# Patient Record
Sex: Male | Born: 1935 | Race: White | Hispanic: No | Marital: Married | Smoking: Former smoker
Health system: Southern US, Community
[De-identification: ages and names within clinical notes are randomized; demographics above are authoritative.]

## PROBLEM LIST (undated history)

## (undated) DIAGNOSIS — I447 Left bundle-branch block, unspecified: Secondary | ICD-10-CM

## (undated) DIAGNOSIS — S2242XA Multiple fractures of ribs, left side, initial encounter for closed fracture: Secondary | ICD-10-CM

## (undated) DIAGNOSIS — I350 Nonrheumatic aortic (valve) stenosis: Secondary | ICD-10-CM

## (undated) DIAGNOSIS — T4145XA Adverse effect of unspecified anesthetic, initial encounter: Secondary | ICD-10-CM

## (undated) DIAGNOSIS — G4730 Sleep apnea, unspecified: Secondary | ICD-10-CM

## (undated) DIAGNOSIS — S42102A Fracture of unspecified part of scapula, left shoulder, initial encounter for closed fracture: Secondary | ICD-10-CM

## (undated) DIAGNOSIS — S20219A Contusion of unspecified front wall of thorax, initial encounter: Secondary | ICD-10-CM

## (undated) DIAGNOSIS — S2239XA Fracture of one rib, unspecified side, initial encounter for closed fracture: Secondary | ICD-10-CM

## (undated) DIAGNOSIS — I428 Other cardiomyopathies: Secondary | ICD-10-CM

## (undated) DIAGNOSIS — G5620 Lesion of ulnar nerve, unspecified upper limb: Secondary | ICD-10-CM

## (undated) DIAGNOSIS — I10 Essential (primary) hypertension: Secondary | ICD-10-CM

## (undated) DIAGNOSIS — I519 Heart disease, unspecified: Secondary | ICD-10-CM

## (undated) DIAGNOSIS — S270XXA Traumatic pneumothorax, initial encounter: Secondary | ICD-10-CM

## (undated) HISTORY — PX: HERNIA REPAIR: SHX51

## (undated) HISTORY — DX: Heart disease, unspecified: I51.9

## (undated) HISTORY — DX: Left bundle-branch block, unspecified: I44.7

## (undated) HISTORY — DX: Other cardiomyopathies: I42.8

## (undated) HISTORY — PX: NO PAST SURGERIES: SHX2092

## (undated) HISTORY — DX: Nonrheumatic aortic (valve) stenosis: I35.0

---
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
## 1999-11-19 ENCOUNTER — Other Ambulatory Visit: Admission: RE | Admit: 1999-11-19 | Discharge: 1999-11-19 | Payer: Self-pay | Admitting: Gastroenterology

## 1999-12-27 ENCOUNTER — Encounter: Payer: Self-pay | Admitting: Urology

## 1999-12-27 ENCOUNTER — Ambulatory Visit (HOSPITAL_COMMUNITY): Admission: RE | Admit: 1999-12-27 | Discharge: 1999-12-27 | Payer: Self-pay | Admitting: Urology

## 2000-01-31 ENCOUNTER — Ambulatory Visit (HOSPITAL_COMMUNITY): Admission: RE | Admit: 2000-01-31 | Discharge: 2000-01-31 | Payer: Self-pay | Admitting: Urology

## 2000-07-06 ENCOUNTER — Encounter: Payer: Self-pay | Admitting: Urology

## 2000-07-06 ENCOUNTER — Ambulatory Visit (HOSPITAL_COMMUNITY): Admission: RE | Admit: 2000-07-06 | Discharge: 2000-07-06 | Payer: Self-pay | Admitting: Urology

## 2001-07-24 ENCOUNTER — Encounter: Payer: Self-pay | Admitting: Urology

## 2001-07-24 ENCOUNTER — Ambulatory Visit (HOSPITAL_COMMUNITY): Admission: RE | Admit: 2001-07-24 | Discharge: 2001-07-24 | Payer: Self-pay | Admitting: Urology

## 2002-08-08 ENCOUNTER — Encounter: Payer: Self-pay | Admitting: Cardiology

## 2002-08-08 ENCOUNTER — Ambulatory Visit (HOSPITAL_COMMUNITY): Admission: RE | Admit: 2002-08-08 | Discharge: 2002-08-08 | Payer: Self-pay | Admitting: Cardiology

## 2002-10-08 ENCOUNTER — Ambulatory Visit (HOSPITAL_BASED_OUTPATIENT_CLINIC_OR_DEPARTMENT_OTHER): Admission: RE | Admit: 2002-10-08 | Discharge: 2002-10-08 | Payer: Self-pay | Admitting: Cardiology

## 2003-05-26 ENCOUNTER — Encounter: Admission: RE | Admit: 2003-05-26 | Discharge: 2003-05-26 | Payer: Self-pay | Admitting: Family Medicine

## 2003-08-22 ENCOUNTER — Emergency Department (HOSPITAL_COMMUNITY): Admission: EM | Admit: 2003-08-22 | Discharge: 2003-08-22 | Payer: Self-pay | Admitting: Emergency Medicine

## 2004-05-11 ENCOUNTER — Ambulatory Visit: Payer: Self-pay | Admitting: Family Medicine

## 2004-09-09 ENCOUNTER — Ambulatory Visit: Payer: Self-pay | Admitting: Family Medicine

## 2005-04-06 ENCOUNTER — Ambulatory Visit: Payer: Self-pay | Admitting: Internal Medicine

## 2011-01-20 ENCOUNTER — Encounter: Payer: Self-pay | Admitting: Gastroenterology

## 2011-01-24 ENCOUNTER — Telehealth: Payer: Self-pay | Admitting: Gastroenterology

## 2011-01-24 NOTE — Telephone Encounter (Signed)
Spoke with pt and he is having problems with an inguinal hernia that is bothering him. Let pt know Dr. Arlyce Dice did not take care of these, he needed to see a surgeon. Pt given the name and number for [facility]. Pt verbalized understanding. Appt cancelled with Dr. Arlyce Dice.

## 2011-03-01 ENCOUNTER — Ambulatory Visit: Payer: Self-pay | Admitting: Gastroenterology

## 2011-03-20 ENCOUNTER — Ambulatory Visit: Payer: Self-pay | Admitting: Family Medicine

## 2011-10-04 ENCOUNTER — Inpatient Hospital Stay (HOSPITAL_COMMUNITY)
Admission: EM | Admit: 2011-10-04 | Discharge: 2011-10-08 | DRG: 200 | Disposition: A | Discharge status: Home / Self Care | Payer: No Typology Code available for payment source | Attending: General Surgery | Admitting: General Surgery

## 2011-10-04 ENCOUNTER — Emergency Department (HOSPITAL_COMMUNITY): Payer: No Typology Code available for payment source

## 2011-10-04 ENCOUNTER — Encounter (HOSPITAL_COMMUNITY): Payer: Self-pay

## 2011-10-04 DIAGNOSIS — S2249XA Multiple fractures of ribs, unspecified side, initial encounter for closed fracture: Secondary | ICD-10-CM

## 2011-10-04 DIAGNOSIS — D62 Acute posthemorrhagic anemia: Secondary | ICD-10-CM | POA: Diagnosis not present

## 2011-10-04 DIAGNOSIS — Y92410 Unspecified street and highway as the place of occurrence of the external cause: Secondary | ICD-10-CM

## 2011-10-04 DIAGNOSIS — S42102A Fracture of unspecified part of scapula, left shoulder, initial encounter for closed fracture: Secondary | ICD-10-CM | POA: Diagnosis present

## 2011-10-04 DIAGNOSIS — S2242XA Multiple fractures of ribs, left side, initial encounter for closed fracture: Secondary | ICD-10-CM

## 2011-10-04 DIAGNOSIS — S2231XA Fracture of one rib, right side, initial encounter for closed fracture: Secondary | ICD-10-CM | POA: Diagnosis present

## 2011-10-04 DIAGNOSIS — S270XXA Traumatic pneumothorax, initial encounter: Secondary | ICD-10-CM

## 2011-10-04 DIAGNOSIS — Z87891 Personal history of nicotine dependence: Secondary | ICD-10-CM

## 2011-10-04 DIAGNOSIS — I10 Essential (primary) hypertension: Secondary | ICD-10-CM | POA: Diagnosis present

## 2011-10-04 DIAGNOSIS — S42109A Fracture of unspecified part of scapula, unspecified shoulder, initial encounter for closed fracture: Secondary | ICD-10-CM

## 2011-10-04 DIAGNOSIS — S40212A Abrasion of left shoulder, initial encounter: Secondary | ICD-10-CM

## 2011-10-04 DIAGNOSIS — S40019A Contusion of unspecified shoulder, initial encounter: Secondary | ICD-10-CM | POA: Diagnosis present

## 2011-10-04 DIAGNOSIS — S42199A Fracture of other part of scapula, unspecified shoulder, initial encounter for closed fracture: Secondary | ICD-10-CM | POA: Diagnosis present

## 2011-10-04 DIAGNOSIS — I428 Other cardiomyopathies: Secondary | ICD-10-CM | POA: Diagnosis present

## 2011-10-04 HISTORY — DX: Fracture of unspecified part of scapula, left shoulder, initial encounter for closed fracture: S42.102A

## 2011-10-04 HISTORY — DX: Other motorcycle driver injured in collision with unspecified motor vehicles in traffic accident, initial encounter: V29.408A

## 2011-10-04 HISTORY — DX: Traumatic pneumothorax, initial encounter: S27.0XXA

## 2011-10-04 HISTORY — DX: Multiple fractures of ribs, left side, initial encounter for closed fracture: S22.42XA

## 2011-10-04 HISTORY — DX: Essential (primary) hypertension: I10

## 2011-10-04 LAB — COMPREHENSIVE METABOLIC PANEL
ALT: 19 U/L (ref 0–53)
AST: 28 U/L (ref 0–37)
Albumin: 3.7 g/dL (ref 3.5–5.2)
Alkaline Phosphatase: 57 U/L (ref 39–117)
BUN: 17 mg/dL (ref 6–23)
CO2: 27 mEq/L (ref 19–32)
Calcium: 9.6 mg/dL (ref 8.4–10.5)
Chloride: 101 mEq/L (ref 96–112)
Creatinine, Ser: 0.95 mg/dL (ref 0.50–1.35)
GFR calc Af Amer: >90 mL/min (ref >90)
GFR calc non Af Amer: 78 mL/min — ABNORMAL LOW (ref >90)
Glucose, Bld: 116 mg/dL — ABNORMAL HIGH (ref 70–99)
Potassium: 3.5 mEq/L (ref 3.5–5.1)
Sodium: 137 mEq/L (ref 135–145)
Total Bilirubin: 0.4 mg/dL (ref 0.3–1.2)
Total Protein: 7 g/dL (ref 6.0–8.3)

## 2011-10-04 LAB — CBC
HCT: 39.9 % (ref 39.0–52.0)
Hemoglobin: 14.1 g/dL (ref 13.0–17.0)
MCH: 31.1 pg (ref 26.0–34.0)
MCHC: 35.3 g/dL (ref 30.0–36.0)
MCV: 87.9 fL (ref 78.0–100.0)
Platelets: 188 10*3/uL (ref 150–400)
RBC: 4.54 MIL/uL (ref 4.22–5.81)
RDW: 13.9 % (ref 11.5–15.5)
WBC: 6.2 10*3/uL (ref 4.0–10.5)

## 2011-10-04 LAB — POCT I-STAT, CHEM 8
BUN: 17 mg/dL (ref 6–23)
Calcium, Ion: 1.18 mmol/L (ref 1.12–1.32)
Chloride: 102 mEq/L (ref 96–112)
Creatinine, Ser: 1.1 mg/dL (ref 0.50–1.35)
Glucose, Bld: 115 mg/dL — ABNORMAL HIGH (ref 70–99)
HCT: 40 % (ref 39.0–52.0)
Hemoglobin: 13.6 g/dL (ref 13.0–17.0)
Potassium: 3.5 mEq/L (ref 3.5–5.1)
Sodium: 139 mEq/L (ref 135–145)
TCO2: 27 mmol/L (ref 0–100)

## 2011-10-04 LAB — DIFFERENTIAL
Basophils Absolute: 0.1 10*3/uL (ref 0.0–0.1)
Basophils Relative: 1 % (ref 0–1)
Eosinophils Absolute: 0.6 10*3/uL (ref 0.0–0.7)
Eosinophils Relative: 9 % — ABNORMAL HIGH (ref 0–5)
Lymphocytes Relative: 42 % (ref 12–46)
Lymphs Abs: 2.6 10*3/uL (ref 0.7–4.0)
Monocytes Absolute: 0.5 10*3/uL (ref 0.1–1.0)
Monocytes Relative: 8 % (ref 3–12)
Neutro Abs: 2.5 10*3/uL (ref 1.7–7.7)
Neutrophils Relative %: 41 % — ABNORMAL LOW (ref 43–77)

## 2011-10-04 MED ORDER — OXYCODONE HCL 5 MG PO TABS
5.0000 mg | ORAL_TABLET | ORAL | Status: DC | PRN
  Administered 2011-10-05 (×2): 5 mg via ORAL
  Filled 2011-10-04: qty 1

## 2011-10-04 MED ORDER — SODIUM CHLORIDE 0.9 % IV SOLN
INTRAVENOUS | Status: DC
  Administered 2011-10-04: 20:00:00 via INTRAVENOUS

## 2011-10-04 MED ORDER — HYDROMORPHONE HCL PF 1 MG/ML IJ SOLN
1.0000 mg | INTRAMUSCULAR | Status: DC | PRN
  Administered 2011-10-04: 1 mg via INTRAVENOUS

## 2011-10-04 MED ORDER — ONDANSETRON HCL 4 MG/2ML IJ SOLN: 4.0000 mg | Freq: Four times a day (QID) | INTRAMUSCULAR | Status: DC | PRN

## 2011-10-04 MED ORDER — IOHEXOL 300 MG/ML  SOLN
100.0000 mL | Freq: Once | INTRAMUSCULAR | Status: AC | PRN
  Administered 2011-10-04: 100 mL via INTRAVENOUS

## 2011-10-04 MED ORDER — HYDROMORPHONE HCL PF 1 MG/ML IJ SOLN
INTRAMUSCULAR | Status: AC
  Filled 2011-10-04: qty 1

## 2011-10-04 MED ORDER — LISINOPRIL 20 MG PO TABS
20.0000 mg | ORAL_TABLET | Freq: Every day | ORAL | Status: DC
  Administered 2011-10-05 – 2011-10-08 (×4): 20 mg via ORAL
  Filled 2011-10-04 (×4): qty 1

## 2011-10-04 MED ORDER — SODIUM CHLORIDE 0.9 % IV SOLN
INTRAVENOUS | Status: DC
  Administered 2011-10-05 (×2): via INTRAVENOUS

## 2011-10-04 MED ORDER — HYDROMORPHONE HCL PF 1 MG/ML IJ SOLN
1.0000 mg | INTRAMUSCULAR | Status: DC | PRN
  Administered 2011-10-05: 1 mg via INTRAVENOUS
  Filled 2011-10-04: qty 1

## 2011-10-04 MED ORDER — ONDANSETRON HCL 4 MG PO TABS
4.0000 mg | ORAL_TABLET | Freq: Four times a day (QID) | ORAL | Status: DC | PRN
  Filled 2011-10-04: qty 0.5

## 2011-10-04 MED ORDER — ENOXAPARIN SODIUM 40 MG/0.4ML ~~LOC~~ SOLN
40.0000 mg | SUBCUTANEOUS | Status: DC
  Administered 2011-10-05 – 2011-10-07 (×3): 40 mg via SUBCUTANEOUS
  Filled 2011-10-04 (×4): qty 0.4

## 2011-10-04 MED ORDER — CARVEDILOL 25 MG PO TABS
25.0000 mg | ORAL_TABLET | Freq: Two times a day (BID) | ORAL | Status: DC
  Administered 2011-10-05 (×2): 25 mg via ORAL
  Filled 2011-10-04 (×5): qty 1

## 2011-10-04 NOTE — Progress Notes (Signed)
Orthopedic Tech Progress Note Patient Details:  Patrick [name] [birth year] [id number]  Patient ID: [name], male   DOB: 1931/10/24, 78 y.o.   MRN: [id number] Made trauma visit  Nikki Dom 10/04/2011, 7:53 PM

## 2011-10-04 NOTE — ED Notes (Signed)
Pt transported to Radiology 

## 2011-10-04 NOTE — ED Notes (Signed)
Pt returned from radiology.
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
## 2011-10-04 NOTE — ED Notes (Signed)
Pt wallet is with [employer] per pt.
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
## 2011-10-05 ENCOUNTER — Inpatient Hospital Stay (HOSPITAL_COMMUNITY): Payer: No Typology Code available for payment source

## 2011-10-05 LAB — BASIC METABOLIC PANEL
BUN: 12 mg/dL (ref 6–23)
CO2: 26 mEq/L (ref 19–32)
Calcium: 8.4 mg/dL (ref 8.4–10.5)
Chloride: 101 mEq/L (ref 96–112)
Creatinine, Ser: 0.68 mg/dL (ref 0.50–1.35)
GFR calc Af Amer: >90 mL/min (ref >90)
GFR calc non Af Amer: 90 mL/min — ABNORMAL LOW (ref >90)
Glucose, Bld: 107 mg/dL — ABNORMAL HIGH (ref 70–99)
Potassium: 3 mEq/L — ABNORMAL LOW (ref 3.5–5.1)
Sodium: 136 mEq/L (ref 135–145)

## 2011-10-05 LAB — CBC
HCT: 35.5 % — ABNORMAL LOW (ref 39.0–52.0)
Hemoglobin: 12.3 g/dL — ABNORMAL LOW (ref 13.0–17.0)
MCH: 30.5 pg (ref 26.0–34.0)
MCHC: 34.6 g/dL (ref 30.0–36.0)
MCV: 88.1 fL (ref 78.0–100.0)
Platelets: 186 10*3/uL (ref 150–400)
RBC: 4.03 MIL/uL — ABNORMAL LOW (ref 4.22–5.81)
RDW: 13.8 % (ref 11.5–15.5)
WBC: 9.6 10*3/uL (ref 4.0–10.5)

## 2011-10-05 MED ORDER — METHOCARBAMOL 500 MG PO TABS: 1000.0000 mg | ORAL_TABLET | Freq: Four times a day (QID) | ORAL | Status: DC | PRN

## 2011-10-05 MED ORDER — KETOROLAC TROMETHAMINE 15 MG/ML IJ SOLN
15.0000 mg | Freq: Four times a day (QID) | INTRAMUSCULAR | Status: DC
  Administered 2011-10-05 – 2011-10-08 (×10): 15 mg via INTRAVENOUS
  Filled 2011-10-05 (×15): qty 1

## 2011-10-05 MED ORDER — TRAMADOL HCL 50 MG PO TABS
100.0000 mg | ORAL_TABLET | Freq: Four times a day (QID) | ORAL | Status: DC
  Administered 2011-10-05 – 2011-10-08 (×11): 100 mg via ORAL
  Filled 2011-10-05 (×17): qty 2

## 2011-10-05 MED ORDER — KETOROLAC TROMETHAMINE 30 MG/ML IJ SOLN
30.0000 mg | Freq: Once | INTRAMUSCULAR | Status: AC
  Administered 2011-10-05: 30 mg via INTRAVENOUS
  Filled 2011-10-05: qty 1
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
## 2011-10-06 ENCOUNTER — Inpatient Hospital Stay (HOSPITAL_COMMUNITY): Payer: No Typology Code available for payment source

## 2011-10-06 LAB — CBC
HCT: 32.8 % — ABNORMAL LOW (ref 39.0–52.0)
Hemoglobin: 11.3 g/dL — ABNORMAL LOW (ref 13.0–17.0)
MCH: 30.5 pg (ref 26.0–34.0)
MCHC: 34.5 g/dL (ref 30.0–36.0)
MCV: 88.6 fL (ref 78.0–100.0)
Platelets: 171 10*3/uL (ref 150–400)
RBC: 3.7 MIL/uL — ABNORMAL LOW (ref 4.22–5.81)
RDW: 14.2 % (ref 11.5–15.5)
WBC: 7.6 10*3/uL (ref 4.0–10.5)

## 2011-10-06 LAB — BASIC METABOLIC PANEL
BUN: 18 mg/dL (ref 6–23)
CO2: 29 mEq/L (ref 19–32)
Calcium: 8.5 mg/dL (ref 8.4–10.5)
Chloride: 100 mEq/L (ref 96–112)
Creatinine, Ser: 0.82 mg/dL (ref 0.50–1.35)
GFR calc Af Amer: >90 mL/min (ref >90)
GFR calc non Af Amer: 83 mL/min — ABNORMAL LOW (ref >90)
Glucose, Bld: 117 mg/dL — ABNORMAL HIGH (ref 70–99)
Potassium: 3.2 mEq/L — ABNORMAL LOW (ref 3.5–5.1)
Sodium: 136 mEq/L (ref 135–145)

## 2011-10-06 MED ORDER — SODIUM CHLORIDE 0.9 % IJ SOLN: 3.0000 mL | INTRAMUSCULAR | Status: DC | PRN

## 2011-10-06 MED ORDER — HYDROMORPHONE HCL PF 1 MG/ML IJ SOLN: 1.0000 mg | INTRAMUSCULAR | Status: DC | PRN

## 2011-10-06 MED ORDER — ACETAMINOPHEN 325 MG PO TABS
650.0000 mg | ORAL_TABLET | Freq: Four times a day (QID) | ORAL | Status: DC
  Administered 2011-10-06 – 2011-10-08 (×9): 650 mg via ORAL
  Filled 2011-10-06 (×9): qty 2

## 2011-10-06 MED ORDER — CARVEDILOL 25 MG PO TABS
25.0000 mg | ORAL_TABLET | Freq: Two times a day (BID) | ORAL | Status: DC
  Administered 2011-10-06 – 2011-10-08 (×4): 25 mg via ORAL
  Filled 2011-10-06 (×6): qty 1

## 2011-10-06 MED ORDER — ASPIRIN 81 MG PO CHEW
81.0000 mg | CHEWABLE_TABLET | Freq: Every day | ORAL | Status: DC
  Administered 2011-10-06 – 2011-10-08 (×3): 81 mg via ORAL
  Filled 2011-10-06 (×3): qty 1

## 2011-10-06 MED ORDER — SODIUM CHLORIDE 0.9 % IJ SOLN
3.0000 mL | Freq: Two times a day (BID) | INTRAMUSCULAR | Status: DC
  Administered 2011-10-06 – 2011-10-07 (×2): 3 mL via INTRAVENOUS

## 2011-10-06 MED ORDER — TRAMADOL HCL 50 MG PO TABS: 100.0000 mg | ORAL_TABLET | Freq: Four times a day (QID) | ORAL | Status: DC

## 2011-10-06 NOTE — Discharge Instructions (Signed)
Pneumothorax A pneumothorax is a collapsed lung. This is a condition that usually occurs suddenly. It happens when air begins leaking from a lung and begins to build up between the lung and the pleura (the thin lining around the lung on the inside of the rib cage). When this is small, there may be minimal pain, difficulty breathing, or other problems. Sometimes it can be followed without being admitted to the hospital. When it is larger, it may require hospitalization. A tube (called a chest tube) may need to be inserted into the air space. It will remove the air between the lung and the rib cage. This immediately expands the lung back to normal size and makes breathing easier. A chest tube is usually in for 1 to 7 days, although it may be longer. CAUSES   It may happen spontaneously with no apparent reason.   It may happen because of previous lung disease or problems. Examples of this include:   Asthma.   Emphysema.   Injury   Rib fractures.   Blunt (from a car accident or a fall).   Sharp (from a knife or gunshot wound).  SYMPTOMS  This condition may be associated with:  Cough.   Chest pain.   Shortness of breath.   Increased rate of breathing.  HOME CARE INSTRUCTIONS   Only take over-the-counter or prescription medicines for pain, discomfort, or fever as directed by your caregiver.   Smoking is a common aggravating factor. Stop smoking.   If a cough or pain makes it difficult to sleep at night, try sleeping in a semi-upright position in a recliner or using 2 or 3 pillows may help.   Rest as you feel it is needed. Your body will help guide you in this.   If you had a chest tube and it was removed, ask your provider when it is okay to remove the dressing.   Do not fly in an airplane or scuba dive after being treated for a pneumothorax until your provider says it is okay.  If you have been sent home with a diagnosis of pneumothorax, it is small and your caregiver believes it  will get better without further treatment. However, your condition can change over time. You must monitor your condition carefully. If you have a significant change in how you feel, carefully follow the instructions below. SEEK IMMEDIATE MEDICAL CARE IF:   You have increasing shortness of breath.   You cannot control your cough with suppressants and are losing sleep.   You begin coughing up blood.   You develop pain which is getting worse or is uncontrolled with medicines.   You have a fever.   You develop pus-like sputum.   Your symptoms which brought you initially in for care are getting worse rather than better or are not controlled with medicines.  MAKE SURE YOU:   Understand these instructions.   Will watch your condition.   Will get help right away if you are not doing well or get worse.  Document Released: 06/12/2005 Document Revised: 06/01/2011 Document Reviewed: 08/06/2008 [facility] Patient Information 2012 [place], [place].  Scapular Fracture You have a fracture (break in bone) of your scapula. This is your shoulder blade. It is the large flat bone behind your shoulder. This is also the bone that makes up the ball and socket joint of your shoulder. Most of the time surgery is not required for injuries to this bone unless the socket of the shoulder joint is involved. DIAGNOSIS  Because of the severity of force usually required to break this bone, x-rays are often taken of other bones likely to be injured at the same time. X-rays of the hip, knee, and pelvis may be taken. Specialized x-rays (arteriograms) may be needed if there are injuries to large blood vessels associated with this injury. HOME CARE INSTRUCTIONS   Simple fractures of the scapula can be treated with a sling and swathe type of immobilization. This means the involved area is held in place by putting the arm in a sling. A wrap is made around the upper arm with the sling holding the arm next to the chest. This may  be removed for bathing as instructed by your caregiver.   Apply ice to the injury for 15 to 20 minutes 3 to 4 times per day. Put the ice in a plastic bag. Place a towel between the bag of ice and your skin, splint, or immobilization device.   Do not resume use until instructed by your caregiver. Usually full rehabilitation (exercises to improve the injury site) will begin sometime after the sling and swathe are removed. Then begin use gradually as directed. Do not increase use to the point of pain. If pain develops, decrease use and continue the above measures. Slowly increase activities that do not cause discomfort until you gradually achieve normal use without pain.   Only take over-the-counter or prescription medicines for pain, discomfort, or fever as directed by your caregiver.  SEEK IMMEDIATE MEDICAL CARE IF:   Your pain and swelling increase and is uncontrolled with medications.   You develop new, unexplained symptoms or an increase of the symptoms which brought you to your caregiver.   You develop shortness or breath or cough up blood.   You are unable to move your arm or fingers. You develop warmth and swelling in your affected arm.   You develop an unexplained temperature.  Document Released: 06/12/2005 Document Revised: 06/01/2011 Document Reviewed: 05/04/2006 [facility] Patient Information 2012 [place], [place].  Rib Fracture Your caregiver has diagnosed you as having a rib fracture (a break). This can occur by a blow to the chest, by a fall against a hard object, or by violent coughing or sneezing. There may be one or many breaks. Rib fractures may heal on their own within 3 to 8 weeks. The longer healing period is usually associated with a continued cough or other aggravating activities. HOME CARE INSTRUCTIONS   Avoid strenuous activity. Be careful during activities and avoid bumping the injured rib. Activities that cause pain pull on the fracture site(s) and are best avoided if  possible.   Eat a normal, well-balanced diet. Drink plenty of fluids to avoid constipation.   Take deep breaths several times a day to keep lungs free of infection. Try to cough several times a day, splinting the injured area with a pillow. This will help prevent pneumonia.   Do not wear a rib belt or binder. These restrict breathing which can lead to pneumonia.   Only take over-the-counter or prescription medicines for pain, discomfort, or fever as directed by your caregiver.  SEEK MEDICAL CARE IF:  You develop a continual cough, associated with thick or bloody sputum. SEEK IMMEDIATE MEDICAL CARE IF:   You have a fever.   You have difficulty breathing.   You have nausea (feeling sick to your stomach), vomiting, or abdominal (belly) pain.   You have worsening pain, not controlled with medications.  Document Released: 06/12/2005 Document Revised: 06/01/2011 Document Reviewed: 11/14/2006  [facility] Patient Information 2012 [place].

## 2011-10-06 NOTE — Clinical Documentation Improvement (Signed)
TEST TEST TEST TEST      Abnormal Labs Clarification  THIS DOCUMENT IS NOT A PERMANENT PART OF THE MEDICAL RECORD  TO RESPOND TO THE THIS QUERY, FOLLOW THE INSTRUCTIONS BELOW:  1. If needed, update documentation for the patient's encounter via the notes activity.  2. Access this query again and click edit on the [employer].  3. After updating, or not, click F2 to complete all highlighted (required) fields concerning your review. Select "additional documentation in the medical record" OR "no additional documentation provided".  4. Click Sign note button.  5. The deficiency will fall out of your InBasket *Please let us know if you are not able to complete this workflow by phone or e-mail (listed below).  Please update your documentation within the medical record to reflect your response to this query.                                                                                   10/06/11  Dear Dr. Rexene Edison Marton Redwood  In a better effort to capture your patient's severity of illness, reflect appropriate length of stay and utilization of resources, a review of the medical record has revealed the following indicators.    Based on your clinical judgment, please clarify and document in a progress note and/or discharge summary the clinical condition associated with the following supporting information:  In responding to this query please exercise your independent judgment.  The fact that a query is asked, does not imply that any particular answer is desired or expected.  Abnormal findings (laboratory, x-ray, pathologic, and other diagnostic results) are not coded and reported unless the physician indicates their clinical significance.   The medical record reflects the following clinical findings, please clarify the diagnostic and/or clinical significance:      Possible Clinical Conditions?                                 Supporting Information:  ____________________                                         Lab Test: ____________________  ____________________                                        Risk Factors: _________________  ____________________                                        Patient Values: ________________                       Normal Values :________________  Other Condition___________________                Treatment: ____________________  Cannot Clinically Determine_________   Reviewed:  no additional documentation provided   Thank You,  Saul Fordyce  Clinical Documentation Specialist: [phone] Pager  Health Information Management [place]
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
## 2011-10-07 ENCOUNTER — Inpatient Hospital Stay (HOSPITAL_COMMUNITY): Payer: No Typology Code available for payment source

## 2011-10-07 MED ORDER — POTASSIUM CHLORIDE CRYS ER 20 MEQ PO TBCR
20.0000 meq | EXTENDED_RELEASE_TABLET | Freq: Three times a day (TID) | ORAL | Status: AC
  Administered 2011-10-07 – 2011-10-08 (×3): 20 meq via ORAL
  Filled 2011-10-07 (×3): qty 1
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
## 2011-10-07 NOTE — Progress Notes (Signed)
Occupational Therapy Treatment Patient Details Name: Patrick [name] MRN: [id number] DOB: 1931-10-01 Today's Date: 10/07/2011  OT Assessment/Plan OT Assessment/Plan Comments on Treatment Session: Pt instructed to wear sling only as needed. OT Plan: All goals met and education completed, patient discharged from OT services Follow Up Recommendations: No OT follow up Equipment Recommended: None recommended by OT;None recommended by PT OT Goals Acute Rehab OT Goals OT Goal Formulation: With patient Time For Goal Achievement: 7 days ADL Goals Pt Will Perform Upper Body Bathing: with modified independence;Sitting, chair;Sitting, edge of bed ADL Goal: Upper Body Bathing - Progress: Met Pt Will Perform Upper Body Dressing: with modified independence;Sitting, chair;Sitting, bed ADL Goal: Upper Body Dressing - Progress: Met Additional ADL Goal #1: Pt will demonstrate proper positioning and donning/doffing of LUE sling. ADL Goal: Additional Goal #1 - Progress: Met  OT Treatment Precautions/Restrictions  Precautions Precautions: None Required Braces or Orthoses: Other Brace/Splint (sling as needed) Restrictions Weight Bearing Restrictions: No   ADL ADL Eating/Feeding: Performed;Independent Where Assessed - Eating/Feeding: Chair Grooming: Performed;Independent Where Assessed - Grooming: Standing at sink Upper Body Bathing: Performed;Set up Where Assessed - Upper Body Bathing: Sitting, chair Upper Body Dressing: Simulated;Supervision/safety Where Assessed - Upper Body Dressing: Sitting, chair Lower Body Dressing: Simulated;Supervision/safety Where Assessed - Lower Body Dressing: Sit to stand from chair Toilet Transfer: Performed;Independent Toilet Transfer Method: Proofreader: Regular height toilet Toileting - Clothing Manipulation: Performed;Independent Where Assessed - Toileting Clothing Manipulation: Standing Toileting - Hygiene:  Performed;Independent Where Assessed - Toileting Hygiene: Sit on 3-in-1 or toilet Ambulation Related to ADLs: indep ADL Comments: Completed all ADL retraining. Pt demonstrated understanding. Mobility  Bed Mobility Bed Mobility: Yes Rolling Right: 7: Independent Transfers Transfers: Yes Sit to Stand: 7: Independent Exercises General Exercises - Upper Extremity Shoulder Flexion: Seated;Self ROM;10 reps Shoulder Extension: Self ROM;10 reps;Left Elbow Flexion: AROM;Left Elbow Extension: AROM;Left Wrist Flexion: AROM;Left  End of Session OT - End of Session Activity Tolerance: Patient tolerated treatment well Patient left: in chair;with call bell in reach Nurse Communication: Mobility status for transfers;Mobility status for ambulation General Behavior During Session: [facility] for tasks performed Cognition: [facility] for tasks performed  [facility]  10/07/2011, 6:19 PM [facility], OTR/L  [phone] 10/07/2011

## 2011-10-07 NOTE — Progress Notes (Signed)
Good spirits Up in chair Hope to d/c tomorrow if pain control adequate Patient examined and I agree with the assessment and plan  Violeta Gelinas, MD, MPH, FACS Pager: [phone]  10/07/2011 4:36 PM

## 2011-10-08 ENCOUNTER — Inpatient Hospital Stay (HOSPITAL_COMMUNITY): Payer: No Typology Code available for payment source

## 2011-10-08 MED ORDER — TRAMADOL HCL 50 MG PO TABS: 100.0000 mg | ORAL_TABLET | Freq: Four times a day (QID) | ORAL | Status: AC

## 2011-10-08 MED ORDER — HYDROCODONE-ACETAMINOPHEN 5-325 MG PO TABS: 1.0000 | ORAL_TABLET | Freq: Four times a day (QID) | ORAL | Status: AC | PRN
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
## 2011-10-13 ENCOUNTER — Telehealth: Payer: Self-pay | Admitting: Physician Assistant

## 2011-10-13 NOTE — Telephone Encounter (Signed)
Pt calls to say that he is having right arm pain in site where IV was during his hospitalization. He wonders if he can come to Trauma Clinic today to have this evaluated as he does not have a primary care physician.  I suggested he go to urgent care for evaluation of this .  He reports that the pain in his ribs is essentially resolved, but the pain in the right arm has been worsening and he actually has been taking some Norco due to the pain in the IV site.   He also is concerned that he is not eating much and has been nauseated after taking Norco and vomited yesterday.   I told him they could evaluate this further at Urgent Care today We will plan to keep his appointment 10/19/11 @2 :00pm

## 2011-10-19 ENCOUNTER — Ambulatory Visit (HOSPITAL_COMMUNITY)
Admission: RE | Admit: 2011-10-19 | Discharge: 2011-10-19 | Disposition: A | Discharge status: Home / Self Care | Payer: No Typology Code available for payment source | Source: Ambulatory Visit | Attending: Physician Assistant | Admitting: Physician Assistant

## 2011-10-19 ENCOUNTER — Encounter (INDEPENDENT_AMBULATORY_CARE_PROVIDER_SITE_OTHER): Payer: Self-pay

## 2011-10-19 ENCOUNTER — Ambulatory Visit (INDEPENDENT_AMBULATORY_CARE_PROVIDER_SITE_OTHER): Payer: Medicare Other | Admitting: Orthopedic Surgery

## 2011-10-19 VITALS — BP 110/60 | HR 54 | Ht 70.0 in | Wt 181.6 lb

## 2011-10-19 DIAGNOSIS — Z09 Encounter for follow-up examination after completed treatment for conditions other than malignant neoplasm: Secondary | ICD-10-CM | POA: Insufficient documentation

## 2011-10-19 DIAGNOSIS — G5620 Lesion of ulnar nerve, unspecified upper limb: Secondary | ICD-10-CM

## 2011-10-19 DIAGNOSIS — I7090 Unspecified atherosclerosis: Secondary | ICD-10-CM | POA: Insufficient documentation

## 2011-10-19 DIAGNOSIS — S270XXA Traumatic pneumothorax, initial encounter: Secondary | ICD-10-CM

## 2011-10-19 DIAGNOSIS — J9383 Other pneumothorax: Secondary | ICD-10-CM | POA: Insufficient documentation

## 2011-10-19 HISTORY — DX: Lesion of ulnar nerve, unspecified upper limb: G56.20

## 2011-10-19 NOTE — Progress Notes (Signed)
Subjective Patient comes in for a followup following left-sided rib fractures and a contralateral pneumothorax. He has been doing well from a pain standpoint and denies any shortness of breath. He had a followup chest x-ray today. His biggest complaint is right arm pain that he attributes to the IV that was in his right hand while he was hospitalized. In addition he notes numbness of the little finger and partial numbness of the ring finger on that side. He denies any exacerbating factors. He thinks the symptoms are possibly very slightly improved from discharge. He also notes he has an appointment with Dr. Dion Saucier for later today.   Objective General: Well-developed well-nourished in no acute distress Lungs: Clear to auscultation bilaterally Right upper extremity: No pain with active or passive range of motion through the elbow or wrist. No deficits in strength. No external signs of trauma. Tinel sign was negative. No tenderness to palpation in the soft tissues of the upper or lower arm or at the elbow or wrist.  Chest x-ray: No obvious pneumothorax noted. Slightly increased right effusion. (Official read pending)   Assessment & Plan 1. Rib fractures/pneumothorax -- he needs to have a followup chest x-ray in the next 3-6 months to evaluate the effusion. Possibly sooner if he develops any shortness of breath, especially with exertion. I told him his primary care provider should obtain this and that he should get one if he doesn't have one.  2. right ulnar neuropathy -- the patient states this has been bothering him since he was hospitalized and so I suspect this was caused by his initial trauma. I told him these typically resolve with time but that if it was not resolved by the time the rest of his bruises resolved that he should contact us or Dr. Dion Saucier for possible further evaluation. He is going to mention this to Dr. Dion Saucier when he sees him later on.  Freeman Caldron, PA-C Pager:  [phone] General Trauma PA Pager: [phone]

## 2011-11-10 ENCOUNTER — Emergency Department (HOSPITAL_COMMUNITY): Payer: No Typology Code available for payment source

## 2011-11-10 ENCOUNTER — Observation Stay (HOSPITAL_COMMUNITY): Payer: No Typology Code available for payment source

## 2011-11-10 ENCOUNTER — Encounter (HOSPITAL_COMMUNITY): Payer: Self-pay | Admitting: Emergency Medicine

## 2011-11-10 ENCOUNTER — Observation Stay (HOSPITAL_COMMUNITY)
Admission: EM | Admit: 2011-11-10 | Discharge: 2011-11-11 | Disposition: A | Discharge status: Home / Self Care | Payer: No Typology Code available for payment source | Attending: Thoracic Surgery (Cardiothoracic Vascular Surgery) | Admitting: Thoracic Surgery (Cardiothoracic Vascular Surgery)

## 2011-11-10 DIAGNOSIS — Y998 Other external cause status: Secondary | ICD-10-CM | POA: Insufficient documentation

## 2011-11-10 DIAGNOSIS — I428 Other cardiomyopathies: Secondary | ICD-10-CM | POA: Insufficient documentation

## 2011-11-10 DIAGNOSIS — S0003XA Contusion of scalp, initial encounter: Secondary | ICD-10-CM | POA: Insufficient documentation

## 2011-11-10 DIAGNOSIS — S20219A Contusion of unspecified front wall of thorax, initial encounter: Secondary | ICD-10-CM

## 2011-11-10 DIAGNOSIS — S1093XA Contusion of unspecified part of neck, initial encounter: Secondary | ICD-10-CM

## 2011-11-10 DIAGNOSIS — D649 Anemia, unspecified: Secondary | ICD-10-CM | POA: Insufficient documentation

## 2011-11-10 DIAGNOSIS — M7989 Other specified soft tissue disorders: Secondary | ICD-10-CM

## 2011-11-10 DIAGNOSIS — IMO0001 Reserved for inherently not codable concepts without codable children: Secondary | ICD-10-CM | POA: Insufficient documentation

## 2011-11-10 DIAGNOSIS — M25519 Pain in unspecified shoulder: Secondary | ICD-10-CM | POA: Insufficient documentation

## 2011-11-10 DIAGNOSIS — S40019A Contusion of unspecified shoulder, initial encounter: Principal | ICD-10-CM | POA: Insufficient documentation

## 2011-11-10 DIAGNOSIS — I10 Essential (primary) hypertension: Secondary | ICD-10-CM | POA: Insufficient documentation

## 2011-11-10 DIAGNOSIS — I498 Other specified cardiac arrhythmias: Secondary | ICD-10-CM

## 2011-11-10 DIAGNOSIS — M79609 Pain in unspecified limb: Secondary | ICD-10-CM

## 2011-11-10 HISTORY — DX: Fracture of one rib, unspecified side, initial encounter for closed fracture: S22.39XA

## 2011-11-10 HISTORY — DX: Sleep apnea, unspecified: G47.30

## 2011-11-10 HISTORY — DX: Contusion of unspecified front wall of thorax, initial encounter: S20.219A

## 2011-11-10 HISTORY — DX: Adverse effect of unspecified anesthetic, initial encounter: T41.45XA

## 2011-11-10 LAB — BASIC METABOLIC PANEL
BUN: 20 mg/dL (ref 6–23)
CO2: 27 mEq/L (ref 19–32)
Calcium: 10 mg/dL (ref 8.4–10.5)
Chloride: 98 mEq/L (ref 96–112)
Creatinine, Ser: 0.85 mg/dL (ref 0.50–1.35)
GFR calc Af Amer: >90 mL/min (ref >90)
GFR calc non Af Amer: 81 mL/min — ABNORMAL LOW (ref >90)
Glucose, Bld: 159 mg/dL — ABNORMAL HIGH (ref 70–99)
Potassium: 3.7 mEq/L (ref 3.5–5.1)
Sodium: 135 mEq/L (ref 135–145)

## 2011-11-10 LAB — CBC
HCT: 33.5 % — ABNORMAL LOW (ref 39.0–52.0)
Hemoglobin: 11.6 g/dL — ABNORMAL LOW (ref 13.0–17.0)
MCH: 30.4 pg (ref 26.0–34.0)
MCHC: 34.6 g/dL (ref 30.0–36.0)
MCV: 87.9 fL (ref 78.0–100.0)
Platelets: 264 10*3/uL (ref 150–400)
RBC: 3.81 MIL/uL — ABNORMAL LOW (ref 4.22–5.81)
RDW: 14.3 % (ref 11.5–15.5)
WBC: 12.3 10*3/uL — ABNORMAL HIGH (ref 4.0–10.5)

## 2011-11-10 LAB — DIFFERENTIAL
Basophils Absolute: 0.1 10*3/uL (ref 0.0–0.1)
Basophils Relative: 1 % (ref 0–1)
Eosinophils Absolute: 0.3 10*3/uL (ref 0.0–0.7)
Eosinophils Relative: 3 % (ref 0–5)
Lymphocytes Relative: 13 % (ref 12–46)
Lymphs Abs: 1.6 10*3/uL (ref 0.7–4.0)
Monocytes Absolute: 0.8 10*3/uL (ref 0.1–1.0)
Monocytes Relative: 7 % (ref 3–12)
Neutro Abs: 9.4 10*3/uL — ABNORMAL HIGH (ref 1.7–7.7)
Neutrophils Relative %: 77 % (ref 43–77)

## 2011-11-10 LAB — TYPE AND SCREEN
ABO/RH(D): A POS
Antibody Screen: NEGATIVE

## 2011-11-10 LAB — ABO/RH: ABO/RH(D): A POS

## 2011-11-10 LAB — HEMOGLOBIN AND HEMATOCRIT, BLOOD
HCT: 33.6 % — ABNORMAL LOW (ref 39.0–52.0)
Hemoglobin: 11.5 g/dL — ABNORMAL LOW (ref 13.0–17.0)

## 2011-11-10 MED ORDER — ACETAMINOPHEN 325 MG PO TABS
650.0000 mg | ORAL_TABLET | Freq: Four times a day (QID) | ORAL | Status: DC | PRN
Start: 2011-11-10 — End: 2011-11-11

## 2011-11-10 MED ORDER — IBUPROFEN 200 MG PO TABS: 400.0000 mg | ORAL_TABLET | Freq: Four times a day (QID) | ORAL | Status: DC | PRN

## 2011-11-10 MED ORDER — FENTANYL CITRATE 0.05 MG/ML IJ SOLN
INTRAMUSCULAR | Status: AC | PRN
  Administered 2011-11-10: 50 ug via INTRAVENOUS

## 2011-11-10 MED ORDER — CARVEDILOL 25 MG PO TABS
25.0000 mg | ORAL_TABLET | Freq: Two times a day (BID) | ORAL | Status: DC
  Administered 2011-11-10: 25 mg via ORAL
  Filled 2011-11-10 (×6): qty 1

## 2011-11-10 MED ORDER — LISINOPRIL 20 MG PO TABS
20.0000 mg | ORAL_TABLET | Freq: Every day | ORAL | Status: DC
  Filled 2011-11-10: qty 1

## 2011-11-10 MED ORDER — MIDAZOLAM HCL 5 MG/5ML IJ SOLN
INTRAMUSCULAR | Status: AC | PRN
  Administered 2011-11-10: 1 mg via INTRAVENOUS

## 2011-11-10 MED ORDER — IOHEXOL 350 MG/ML SOLN
100.0000 mL | Freq: Once | INTRAVENOUS | Status: AC | PRN
  Administered 2011-11-10: 100 mL via INTRAVENOUS

## 2011-11-10 MED ORDER — IODIXANOL 320 MG/ML IV SOLN
100.0000 mL | Freq: Once | INTRAVENOUS | Status: AC | PRN
  Administered 2011-11-10: 100 mL via INTRAVENOUS

## 2011-11-10 MED ORDER — LACTATED RINGERS IV SOLN: INTRAVENOUS | Status: DC

## 2011-11-10 MED ORDER — MORPHINE SULFATE 4 MG/ML IJ SOLN
4.0000 mg | Freq: Once | INTRAMUSCULAR | Status: AC
  Administered 2011-11-10: 4 mg via INTRAVENOUS
  Filled 2011-11-10: qty 1
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
## 2011-11-10 NOTE — Progress Notes (Addendum)
                                                  Subjective: Stable. Arteriogram shows no bleeding. Hematoma stable.  Objective: Vital signs in last 24 hours: Temp:  [98.3 F (36.8 C)-98.5 F (36.9 C)] 98.5 F (36.9 C) (05/17 0847) Pulse Rate:  [30-75] 75  (05/17 0847) Cardiac Rhythm:  [-]  Resp:  [14-26] 18  (05/17 0847) BP: (133-162)/(53-95) 136/62 mmHg (05/17 0847) SpO2:  [96 %-100 %] 100 % (05/17 0847)  Hemodynamic parameters for last 24 hours:    Intake/Output from previous day:   Intake/Output this shift:    General appearance: alert  Lab Results:  Basename 11/10/11 1505 11/10/11 0303  WBC -- 12.3*  HGB 11.5* 11.6*  HCT 33.6* 33.5*  PLT -- 264   BMET:  Basename 11/10/11 0303  NA 135  K 3.7  CL 98  CO2 27  GLUCOSE 159*  BUN 20  CREATININE 0.85  CALCIUM 10.0    PT/INR: No results found for this basename: LABPROT,INR in the last 72 hours ABG    Component Value Date/Time   TCO2 27 10/04/2011 2003   CBG (last 3)  No results found for this basename: GLUCAP:3 in the last 72 hours  Assessment/Plan: S/P   watch for enlargement of hematoma recheck lab am.   LOS: 0 days    [name] [name] 11/10/2011   [name] [name] remains completely stable.  Will recheck Hgb and exam in am and tentatively plan d/c home if he remains stable.  Will keep left arm in sling for at least 2 weeks to minimize mobility of fracture.  He will need f/u visit in Trauma Clinic with a CXR in 2 weeks.  He also should be seen in f/u by Dr Dion Saucier.  He does not need f/u in our office since he has no cardiothoracic surgical issues.  The Trauma surgeon on call refused to see him today.  [name] [name] H 11/10/2011 5:41 PM
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
## 2011-11-10 NOTE — ED Notes (Signed)
Pt will go to IR for procedure prior to going to 2000. Pt and family made aware. Report given to 2000.

## 2011-11-10 NOTE — ED Notes (Signed)
Pt given ice pack to place on left shoulder area. Ortho Tech paged to bring shoulder sling.
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
## 2011-11-10 NOTE — Procedures (Signed)
Left subclavian arteriogram negative for discrete area of vessel injury, specifically, no active extravasation or vessel dissection.  No immediate post procedural complications.

## 2011-11-10 NOTE — ED Provider Notes (Addendum)
Assumed care from Dr. Hyman Hopes. Reviewed chart, ct, labs, and reevaluated pt.   Agree with assessment and plan.   CT surg. Asked [place] to call trauma back.  Spoke with dr. Lindie Spruce.  He said to call the other md on for trauma.   Spoke with dr. Carolynne Edouard.  He will come consult on pt.  Dr. Barry Dienes will admit pt for IR and observation.   Cheri Guppy, MD 11/10/11 [phone]  CRITICAL CARE Performed by: Nicholes Stairs   Total critical care time: 45 min  Critical care time was exclusive of separately billable procedures and treating other patients.  Critical care was necessary to treat or prevent imminent or life-threatening deterioration.  Critical care was time spent personally by me on the following activities: development of treatment plan with patient and/or surrogate as well as nursing, discussions with consultants, evaluation of patient's response to treatment, examination of patient, obtaining history from patient or surrogate, ordering and performing treatments and interventions, ordering and review of laboratory studies, ordering and review of radiographic studies, pulse oximetry and re-evaluation of patient's condition.  Critical care for collective consultations  Between me and Dr. Hyman Hopes to Trauma service X3, CT surgery, vasc surgery, and IR services, along with reassessments.     Cheri Guppy, MD 11/10/11 [phone]
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
## 2011-11-10 NOTE — Progress Notes (Signed)
Orthopedic Tech Progress Note Patient Details:  Patrick [name] 07-18-1931 [id number]  Other Ortho Devices Type of Ortho Device: Other (comment) Ortho Device Location: left arm Ortho Device Interventions: Application   Jared Cahn T 11/10/2011, 8:53 AM
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
## 2011-11-11 ENCOUNTER — Observation Stay (HOSPITAL_COMMUNITY): Payer: No Typology Code available for payment source

## 2011-11-11 LAB — BASIC METABOLIC PANEL
BUN: 15 mg/dL (ref 6–23)
CO2: 29 mEq/L (ref 19–32)
Calcium: 8.9 mg/dL (ref 8.4–10.5)
Chloride: 101 mEq/L (ref 96–112)
Creatinine, Ser: 0.69 mg/dL (ref 0.50–1.35)
GFR calc Af Amer: >90 mL/min (ref >90)
GFR calc non Af Amer: 89 mL/min — ABNORMAL LOW (ref >90)
Glucose, Bld: 104 mg/dL — ABNORMAL HIGH (ref 70–99)
Potassium: 3.1 mEq/L — ABNORMAL LOW (ref 3.5–5.1)
Sodium: 137 mEq/L (ref 135–145)

## 2011-11-11 LAB — CBC
HCT: 27.9 % — ABNORMAL LOW (ref 39.0–52.0)
Hemoglobin: 9.5 g/dL — ABNORMAL LOW (ref 13.0–17.0)
MCH: 30 pg (ref 26.0–34.0)
MCHC: 34.1 g/dL (ref 30.0–36.0)
MCV: 88 fL (ref 78.0–100.0)
Platelets: 206 10*3/uL (ref 150–400)
RBC: 3.17 MIL/uL — ABNORMAL LOW (ref 4.22–5.81)
RDW: 14.6 % (ref 11.5–15.5)
WBC: 5.8 10*3/uL (ref 4.0–10.5)

## 2011-11-11 MED ORDER — POTASSIUM CHLORIDE CRYS ER 20 MEQ PO TBCR
40.0000 meq | EXTENDED_RELEASE_TABLET | Freq: Once | ORAL | Status: AC
  Administered 2011-11-11: 40 meq via ORAL
  Filled 2011-11-11: qty 4

## 2011-11-11 MED ORDER — FERROUS SULFATE 325 (65 FE) MG PO TABS: 325.0000 mg | ORAL_TABLET | Freq: Every day | ORAL | Status: DC

## 2011-11-11 MED ORDER — ACETAMINOPHEN 325 MG PO TABS: 650.0000 mg | ORAL_TABLET | Freq: Four times a day (QID) | ORAL | Status: AC | PRN

## 2011-11-11 MED ORDER — POTASSIUM CHLORIDE CRYS ER 20 MEQ PO TBCR
40.0000 meq | EXTENDED_RELEASE_TABLET | Freq: Once | ORAL | Status: AC
  Administered 2011-11-11: 40 meq via ORAL
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
## 2011-11-11 NOTE — Progress Notes (Addendum)
   Subjective: Patient believes hematoma of left posterior neck/subclvacular area is decreasing.  Objective: Vital signs in last 24 hours: Patient Vitals for the past 24 hrs:  BP Temp Temp src Pulse Resp SpO2 Height Weight  11/11/11 0424 140/88 mmHg 98.1 F (36.7 C) Oral 70  18  96 % - -  11/10/11 2100 - - - - - - - 176 lb 5.9 oz (80 kg)  11/10/11 1952 109/62 mmHg 98.3 F (36.8 C) Oral 63  18  94 % - -  11/10/11 1700 - - - - - - 5\' 10"  (1.778 m) -  11/10/11 1330 139/71 mmHg - - 80  - - - -  11/10/11 1230 130/60 mmHg - - - - - - -  11/10/11 1200 127/62 mmHg - - 71  - - - -  11/10/11 1130 138/84 mmHg - - 78  - 100 % - -  11/10/11 1100 140/72 mmHg - - 81  - - - -  11/10/11 0847 136/62 mmHg 98.5 F (36.9 C) Oral 75  18  100 % - -    Current Weight  11/10/11 176 lb 5.9 oz (80 kg)      Intake/Output from previous day: 05/17 0701 - 05/18 0700 In: -  Out: 600 [Urine:600]   Physical Exam:  Cardiovascular: RRR, no murmurs, gallops, or rubs. Pulmonary: Clear to auscultation bilaterally; no rales, wheezes, or rhonchi. Abdomen: Soft, non tender, bowel sounds present. Wounds: Right groin dressing is clean and dry.  Hematoma left posterior neck/subscapular area, minor ecchymosis.  Lab Results: CBC: Basename 11/11/11 0500 11/10/11 1505 11/10/11 0303  WBC 5.8 -- 12.3*  HGB 9.5* 11.5* --  HCT 27.9* 33.6* --  PLT 206 -- 264   BMET:  Basename 11/11/11 0500 11/10/11 0303  NA 137 135  K 3.1* 3.7  CL 101 98  CO2 29 27  GLUCOSE 104* 159*  BUN 15 20  CREATININE 0.69 0.85  CALCIUM 8.9 10.0    PT/INR: No results found for this basename: LABPROT,INR in the last 72 hours ABG:  INR: Will add last result for INR, ABG once components are confirmed Will add last 4 CBG results once components are confirmed  Assessment/Plan: 1.H/H decreased from 11.5/33.6 to 9.5/27.9. 2.Supplement potassium. 3.Ok to discharge as discussed with Dr. Cornelius Moras. Will arrange a CBC and BMET to be drawn on  Monday as well as follow up with Dr. Cornelius Moras. 4.Patinet to call for follow up with trauma service and Dr. Pola Corn was also instructed to keep left shoulder in a sling.  Shanetha Bradham MPA-C 11/11/2011

## 2011-11-11 NOTE — Progress Notes (Signed)
Pt. Discharged 11/11/2011  9:49 AM Discharge instructions reviewed with patient/family. Patient/family verbalized understanding. All Rx's given. Questions answered as needed. Pt. Discharged to home with family/self.  Noah Charon

## 2011-11-11 NOTE — Discharge Instructions (Signed)
Activity: 1.May walk up steps                2.No lifting more than ten pounds for four weeks.                 3.No driving for four weeks.                4.Stop any activity that causes chest pain, shortness of breath, dizziness,   sweating or excessive weakness.                5.Avoid straining.                6.Continue with your breathing exercises daily.  Diet: Regular  Wound Care: May shower.  Left arm to remain in a sling.

## 2011-11-11 NOTE — Discharge Summary (Signed)
I agree with the above discharge summary and plan for follow-up.  Cerissa Zeiger H  

## 2011-11-13 ENCOUNTER — Ambulatory Visit (INDEPENDENT_AMBULATORY_CARE_PROVIDER_SITE_OTHER): Payer: Medicare Other | Admitting: Thoracic Surgery (Cardiothoracic Vascular Surgery)

## 2011-11-13 ENCOUNTER — Other Ambulatory Visit: Payer: Self-pay | Admitting: Thoracic Surgery (Cardiothoracic Vascular Surgery)

## 2011-11-13 ENCOUNTER — Encounter: Payer: Self-pay | Admitting: Thoracic Surgery (Cardiothoracic Vascular Surgery)

## 2011-11-13 VITALS — BP 140/76 | HR 72 | Resp 18 | Ht 70.0 in | Wt 182.0 lb

## 2011-11-13 DIAGNOSIS — S20219A Contusion of unspecified front wall of thorax, initial encounter: Secondary | ICD-10-CM

## 2011-11-13 LAB — BASIC METABOLIC PANEL
BUN: 16 mg/dL (ref 6–23)
CO2: 30 mEq/L (ref 19–32)
Calcium: 10 mg/dL (ref 8.4–10.5)
Chloride: 103 mEq/L (ref 96–112)
Creat: 0.8 mg/dL (ref 0.50–1.35)
Glucose, Bld: 103 mg/dL — ABNORMAL HIGH (ref 70–99)
Potassium: 4.2 mEq/L (ref 3.5–5.3)
Sodium: 140 mEq/L (ref 135–145)

## 2011-11-13 LAB — CBC
HCT: 30.4 % — ABNORMAL LOW (ref 39.0–52.0)
Hemoglobin: 10.2 g/dL — ABNORMAL LOW (ref 13.0–17.0)
MCH: 31.1 pg (ref 26.0–34.0)
MCHC: 33.7 g/dL (ref 30.0–36.0)
MCV: 92.6 fL (ref 78.0–100.0)
Platelets: 230 10*3/uL (ref 150–400)
RBC: 3.28 MIL/uL — ABNORMAL LOW (ref 4.22–5.81)
RDW: 13.9 % (ref 11.5–15.5)
WBC: 5.8 10*3/uL (ref 4.0–10.5)
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
## 2011-11-13 NOTE — Patient Instructions (Signed)
Maintain left arm in a sling for at least 2 weeks. Then begin to gradually increase activity but refrain from lifting left arm above the lobe of the shoulder for at least another 2 weeks. Continue to hold aspirin therapy for at least 4 weeks. Make certain to keep appointment with the trauma clinic and Dr. Dion Saucier.

## 2012-05-21 DIAGNOSIS — Z0279 Encounter for issue of other medical certificate: Secondary | ICD-10-CM
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
## 2014-02-08 IMAGING — CR DG CHEST 1V PORT
1 series · 1 of 1 positions shown · non-contrast
Comparison: Chest radiograph 10/07/2011

CLINICAL DATA: Pneumonia, hydropneumothorax

PORTABLE CHEST - 1 VIEW

[AP]
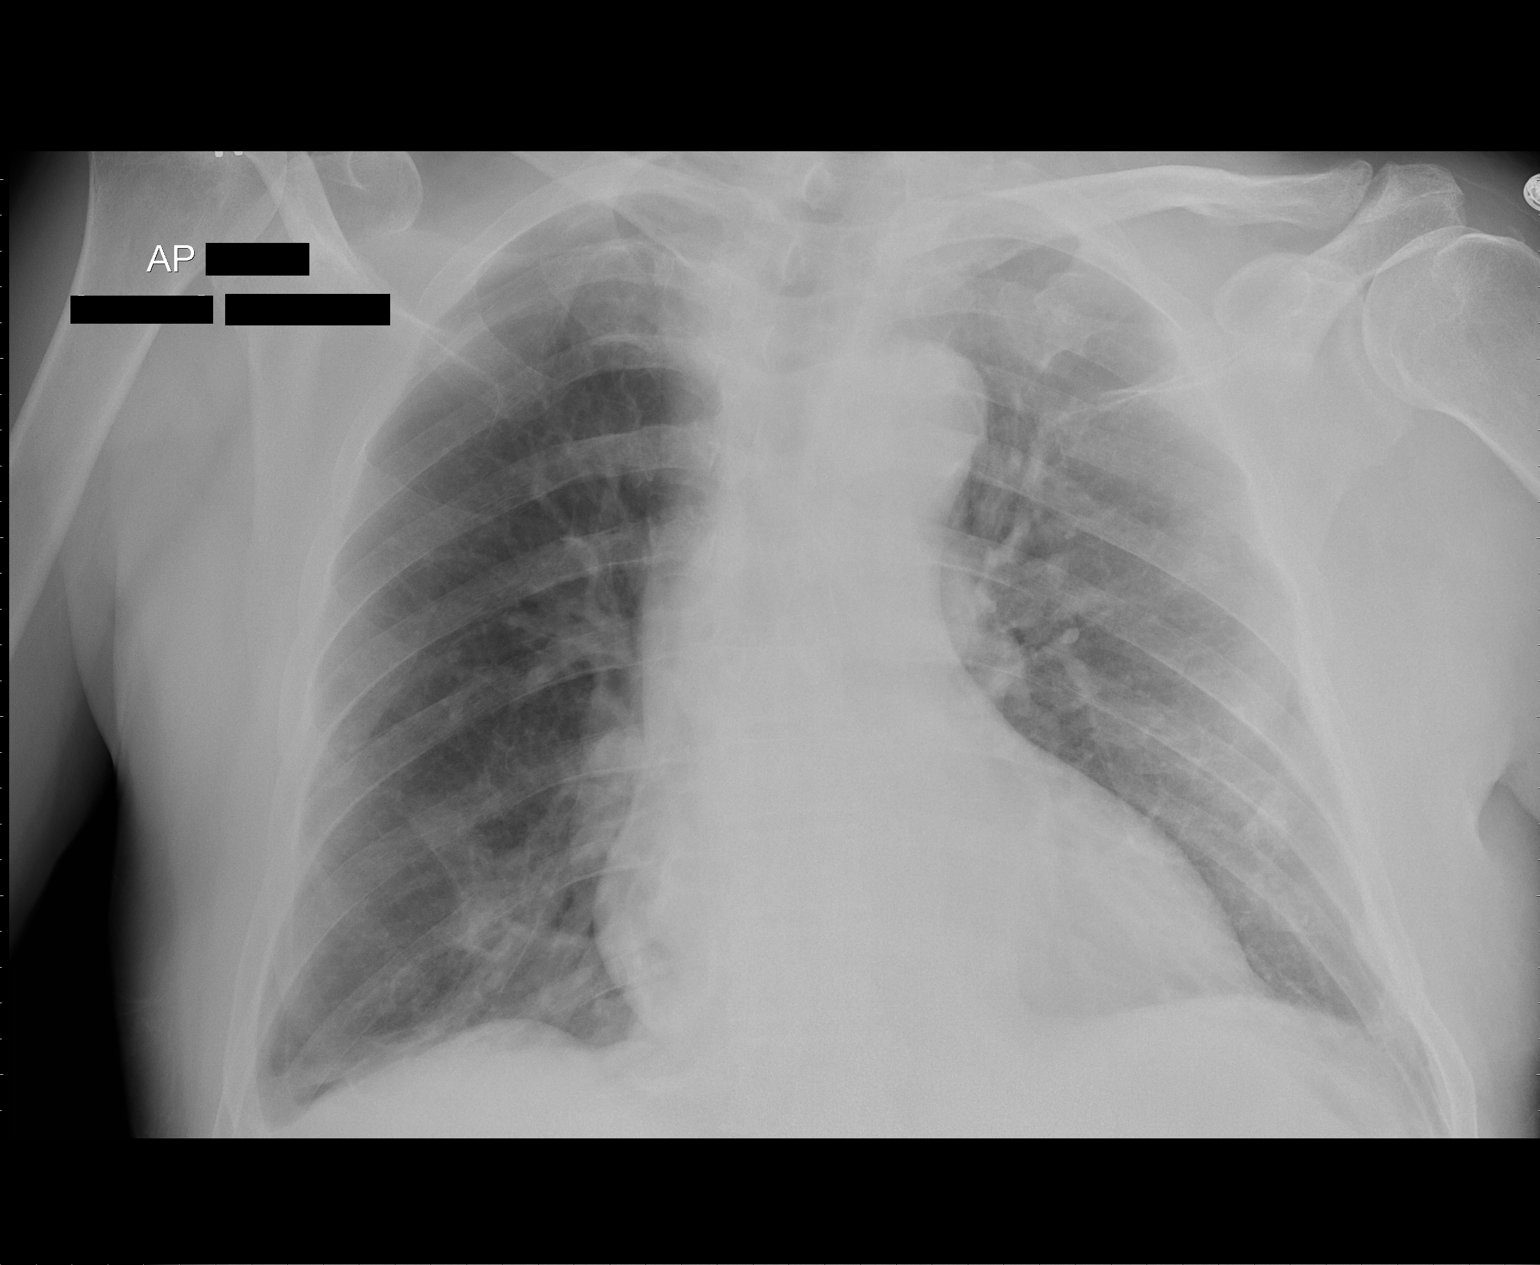

[1 of 1 positions shown; findings below may reference images not displayed]

FINDINGS: The right apical pneumothorax is mildly decreased in
measuring 10 mm from the apical chest wall compared to 12 mm on
prior.  There is decrease in fluid at the right lung base with
decrease in the subpulmonic pneumothorax.  Small basilar
atelectasis at the left lung bases unchanged.
IMPRESSION: Decrease in volume of right hydropneumothorax.

## 2014-07-24 DIAGNOSIS — N323 Diverticulum of bladder: Secondary | ICD-10-CM | POA: Insufficient documentation

## 2014-08-26 DIAGNOSIS — Z8546 Personal history of malignant neoplasm of prostate: Secondary | ICD-10-CM | POA: Insufficient documentation

## 2018-09-18 ENCOUNTER — Telehealth: Payer: Self-pay

## 2018-09-18 NOTE — Telephone Encounter (Signed)
NOTES ON FILE RJ 

## 2018-11-19 ENCOUNTER — Telehealth: Payer: Self-pay | Admitting: *Deleted

## 2018-11-19 NOTE — Telephone Encounter (Signed)
REFERRAL SENT TO SCHEDULING AND NOTES ON FILE FROM [facility] DR. RAYMOND ROSARIO  [phone]. °

## 2019-03-06 DIAGNOSIS — I447 Left bundle-branch block, unspecified: Secondary | ICD-10-CM | POA: Insufficient documentation

## 2019-03-14 ENCOUNTER — Telehealth: Payer: Self-pay

## 2019-03-14 NOTE — Telephone Encounter (Signed)
Left a message regarding appt on 03/17/19.

## 2019-03-17 ENCOUNTER — Telehealth: Payer: Medicare Other | Admitting: Internal Medicine

## 2019-03-17 ENCOUNTER — Other Ambulatory Visit: Payer: Self-pay

## 2019-03-17 DIAGNOSIS — I10 Essential (primary) hypertension: Secondary | ICD-10-CM

## 2019-03-17 NOTE — Progress Notes (Signed)
Did not answer Left VM  Will reschedule for another day

## 2019-03-24 ENCOUNTER — Telehealth (INDEPENDENT_AMBULATORY_CARE_PROVIDER_SITE_OTHER): Payer: Medicare Other | Admitting: Internal Medicine

## 2019-03-24 ENCOUNTER — Encounter: Payer: Self-pay | Admitting: Internal Medicine

## 2019-03-24 ENCOUNTER — Other Ambulatory Visit: Payer: Self-pay

## 2019-03-24 DIAGNOSIS — I480 Paroxysmal atrial fibrillation: Secondary | ICD-10-CM

## 2019-03-24 DIAGNOSIS — I10 Essential (primary) hypertension: Secondary | ICD-10-CM | POA: Diagnosis not present

## 2019-03-24 DIAGNOSIS — I428 Other cardiomyopathies: Secondary | ICD-10-CM | POA: Diagnosis not present

## 2019-03-24 DIAGNOSIS — I519 Heart disease, unspecified: Secondary | ICD-10-CM
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
## 2019-06-23 DIAGNOSIS — R911 Solitary pulmonary nodule: Secondary | ICD-10-CM | POA: Insufficient documentation

## 2019-07-26 ENCOUNTER — Ambulatory Visit: Payer: Medicare Other

## 2019-08-06 DIAGNOSIS — I77811 Abdominal aortic ectasia: Secondary | ICD-10-CM | POA: Insufficient documentation

## 2019-09-18 DIAGNOSIS — S32010A Wedge compression fracture of first lumbar vertebra, initial encounter for closed fracture: Secondary | ICD-10-CM | POA: Insufficient documentation

## 2020-02-02 DIAGNOSIS — G8929 Other chronic pain: Secondary | ICD-10-CM | POA: Insufficient documentation

## 2020-02-02 DIAGNOSIS — M25512 Pain in left shoulder: Secondary | ICD-10-CM | POA: Insufficient documentation

## 2020-05-19 ENCOUNTER — Encounter (HOSPITAL_COMMUNITY): Payer: Self-pay

## 2020-05-19 ENCOUNTER — Emergency Department (HOSPITAL_COMMUNITY): Payer: Medicare Other

## 2020-05-19 ENCOUNTER — Other Ambulatory Visit: Payer: Self-pay

## 2020-05-19 ENCOUNTER — Inpatient Hospital Stay (HOSPITAL_COMMUNITY)
Admission: EM | Admit: 2020-05-19 | Discharge: 2020-05-29 | DRG: 519 | Disposition: A | Discharge status: Skilled Nursing Facility | Payer: Medicare Other | Attending: Neurological Surgery | Admitting: Neurological Surgery

## 2020-05-19 DIAGNOSIS — Y92009 Unspecified place in unspecified non-institutional (private) residence as the place of occurrence of the external cause: Secondary | ICD-10-CM

## 2020-05-19 DIAGNOSIS — Z923 Personal history of irradiation: Secondary | ICD-10-CM

## 2020-05-19 DIAGNOSIS — D649 Anemia, unspecified: Secondary | ICD-10-CM | POA: Diagnosis present

## 2020-05-19 DIAGNOSIS — W010XXA Fall on same level from slipping, tripping and stumbling without subsequent striking against object, initial encounter: Secondary | ICD-10-CM | POA: Diagnosis present

## 2020-05-19 DIAGNOSIS — I160 Hypertensive urgency: Secondary | ICD-10-CM | POA: Diagnosis present

## 2020-05-19 DIAGNOSIS — R29898 Other symptoms and signs involving the musculoskeletal system: Secondary | ICD-10-CM

## 2020-05-19 DIAGNOSIS — I110 Hypertensive heart disease with heart failure: Secondary | ICD-10-CM | POA: Diagnosis present

## 2020-05-19 DIAGNOSIS — G9529 Other cord compression: Secondary | ICD-10-CM | POA: Diagnosis present

## 2020-05-19 DIAGNOSIS — Z9079 Acquired absence of other genital organ(s): Secondary | ICD-10-CM

## 2020-05-19 DIAGNOSIS — C7951 Secondary malignant neoplasm of bone: Principal | ICD-10-CM | POA: Diagnosis present

## 2020-05-19 DIAGNOSIS — G992 Myelopathy in diseases classified elsewhere: Secondary | ICD-10-CM | POA: Diagnosis present

## 2020-05-19 DIAGNOSIS — D492 Neoplasm of unspecified behavior of bone, soft tissue, and skin: Secondary | ICD-10-CM | POA: Diagnosis present

## 2020-05-19 DIAGNOSIS — Z20822 Contact with and (suspected) exposure to covid-19: Secondary | ICD-10-CM | POA: Diagnosis present

## 2020-05-19 DIAGNOSIS — Z79899 Other long term (current) drug therapy: Secondary | ICD-10-CM

## 2020-05-19 DIAGNOSIS — E860 Dehydration: Secondary | ICD-10-CM | POA: Diagnosis present

## 2020-05-19 DIAGNOSIS — Z66 Do not resuscitate: Secondary | ICD-10-CM | POA: Diagnosis present

## 2020-05-19 DIAGNOSIS — Z419 Encounter for procedure for purposes other than remedying health state, unspecified: Secondary | ICD-10-CM

## 2020-05-19 DIAGNOSIS — Z7901 Long term (current) use of anticoagulants: Secondary | ICD-10-CM

## 2020-05-19 DIAGNOSIS — I428 Other cardiomyopathies: Secondary | ICD-10-CM | POA: Diagnosis present

## 2020-05-19 DIAGNOSIS — I5022 Chronic systolic (congestive) heart failure: Secondary | ICD-10-CM | POA: Diagnosis present

## 2020-05-19 DIAGNOSIS — Z9221 Personal history of antineoplastic chemotherapy: Secondary | ICD-10-CM

## 2020-05-19 DIAGNOSIS — Z87891 Personal history of nicotine dependence: Secondary | ICD-10-CM

## 2020-05-19 DIAGNOSIS — I4819 Other persistent atrial fibrillation: Secondary | ICD-10-CM | POA: Diagnosis present

## 2020-05-19 DIAGNOSIS — S0101XA Laceration without foreign body of scalp, initial encounter: Secondary | ICD-10-CM | POA: Diagnosis present

## 2020-05-19 DIAGNOSIS — C8330 Diffuse large B-cell lymphoma, unspecified site: Secondary | ICD-10-CM

## 2020-05-19 DIAGNOSIS — E611 Iron deficiency: Secondary | ICD-10-CM | POA: Diagnosis present

## 2020-05-19 DIAGNOSIS — C61 Malignant neoplasm of prostate: Secondary | ICD-10-CM | POA: Diagnosis present

## 2020-05-19 DIAGNOSIS — M549 Dorsalgia, unspecified: Secondary | ICD-10-CM | POA: Diagnosis not present

## 2020-05-19 DIAGNOSIS — Z23 Encounter for immunization: Secondary | ICD-10-CM

## 2020-05-19 MED ORDER — LIDOCAINE-EPINEPHRINE-TETRACAINE (LET) TOPICAL GEL
3.0000 mL | Freq: Once | TOPICAL | Status: AC
  Administered 2020-05-20: 3 mL via TOPICAL
  Filled 2020-05-19: qty 3

## 2020-05-19 MED ORDER — TETANUS-DIPHTH-ACELL PERTUSSIS 5-2.5-18.5 LF-MCG/0.5 IM SUSY
0.5000 mL | PREFILLED_SYRINGE | Freq: Once | INTRAMUSCULAR | Status: AC
  Administered 2020-05-20: 0.5 mL via INTRAMUSCULAR
  Filled 2020-05-19: qty 0.5
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
## 2020-05-19 NOTE — ED Triage Notes (Signed)
Pt reports a fall while getting in to the shower.

## 2020-05-20 ENCOUNTER — Emergency Department (HOSPITAL_COMMUNITY): Payer: Medicare Other

## 2020-05-20 ENCOUNTER — Encounter (HOSPITAL_COMMUNITY): Payer: Self-pay | Admitting: Neurological Surgery

## 2020-05-20 DIAGNOSIS — C7951 Secondary malignant neoplasm of bone: Secondary | ICD-10-CM | POA: Diagnosis present

## 2020-05-20 DIAGNOSIS — E611 Iron deficiency: Secondary | ICD-10-CM | POA: Diagnosis present

## 2020-05-20 DIAGNOSIS — Z7901 Long term (current) use of anticoagulants: Secondary | ICD-10-CM | POA: Diagnosis not present

## 2020-05-20 DIAGNOSIS — D649 Anemia, unspecified: Secondary | ICD-10-CM | POA: Diagnosis present

## 2020-05-20 DIAGNOSIS — Z66 Do not resuscitate: Secondary | ICD-10-CM | POA: Diagnosis present

## 2020-05-20 DIAGNOSIS — E860 Dehydration: Secondary | ICD-10-CM | POA: Diagnosis present

## 2020-05-20 DIAGNOSIS — M549 Dorsalgia, unspecified: Secondary | ICD-10-CM | POA: Diagnosis present

## 2020-05-20 DIAGNOSIS — Y92009 Unspecified place in unspecified non-institutional (private) residence as the place of occurrence of the external cause: Secondary | ICD-10-CM

## 2020-05-20 DIAGNOSIS — Z9079 Acquired absence of other genital organ(s): Secondary | ICD-10-CM | POA: Diagnosis not present

## 2020-05-20 DIAGNOSIS — I160 Hypertensive urgency: Secondary | ICD-10-CM | POA: Diagnosis present

## 2020-05-20 DIAGNOSIS — S0101XA Laceration without foreign body of scalp, initial encounter: Secondary | ICD-10-CM | POA: Diagnosis present

## 2020-05-20 DIAGNOSIS — C61 Malignant neoplasm of prostate: Secondary | ICD-10-CM | POA: Diagnosis present

## 2020-05-20 DIAGNOSIS — W19XXXA Unspecified fall, initial encounter: Secondary | ICD-10-CM

## 2020-05-20 DIAGNOSIS — Z79899 Other long term (current) drug therapy: Secondary | ICD-10-CM | POA: Diagnosis not present

## 2020-05-20 DIAGNOSIS — Z9221 Personal history of antineoplastic chemotherapy: Secondary | ICD-10-CM | POA: Diagnosis not present

## 2020-05-20 DIAGNOSIS — G992 Myelopathy in diseases classified elsewhere: Secondary | ICD-10-CM | POA: Diagnosis present

## 2020-05-20 DIAGNOSIS — G9529 Other cord compression: Secondary | ICD-10-CM | POA: Diagnosis not present

## 2020-05-20 DIAGNOSIS — Z20822 Contact with and (suspected) exposure to covid-19: Secondary | ICD-10-CM | POA: Diagnosis present

## 2020-05-20 DIAGNOSIS — I4819 Other persistent atrial fibrillation: Secondary | ICD-10-CM | POA: Diagnosis present

## 2020-05-20 DIAGNOSIS — Z23 Encounter for immunization: Secondary | ICD-10-CM | POA: Diagnosis not present

## 2020-05-20 DIAGNOSIS — Z923 Personal history of irradiation: Secondary | ICD-10-CM | POA: Diagnosis not present

## 2020-05-20 DIAGNOSIS — C419 Malignant neoplasm of bone and articular cartilage, unspecified: Secondary | ICD-10-CM | POA: Insufficient documentation

## 2020-05-20 DIAGNOSIS — I110 Hypertensive heart disease with heart failure: Secondary | ICD-10-CM | POA: Diagnosis present

## 2020-05-20 DIAGNOSIS — I428 Other cardiomyopathies: Secondary | ICD-10-CM | POA: Diagnosis present

## 2020-05-20 DIAGNOSIS — W010XXA Fall on same level from slipping, tripping and stumbling without subsequent striking against object, initial encounter: Secondary | ICD-10-CM | POA: Diagnosis present

## 2020-05-20 DIAGNOSIS — I5022 Chronic systolic (congestive) heart failure: Secondary | ICD-10-CM | POA: Diagnosis present

## 2020-05-20 DIAGNOSIS — Z87891 Personal history of nicotine dependence: Secondary | ICD-10-CM | POA: Diagnosis not present

## 2020-05-20 DIAGNOSIS — R29898 Other symptoms and signs involving the musculoskeletal system: Secondary | ICD-10-CM | POA: Diagnosis not present

## 2020-05-20 LAB — CBC WITH DIFFERENTIAL/PLATELET
Abs Immature Granulocytes: 0.01 10*3/uL (ref 0.00–0.07)
Basophils Absolute: 0 10*3/uL (ref 0.0–0.1)
Basophils Relative: 1 %
Eosinophils Absolute: 0.1 10*3/uL (ref 0.0–0.5)
Eosinophils Relative: 1 %
HCT: 34.5 % — ABNORMAL LOW (ref 39.0–52.0)
Hemoglobin: 11.3 g/dL — ABNORMAL LOW (ref 13.0–17.0)
Immature Granulocytes: 0 %
Lymphocytes Relative: 24 %
Lymphs Abs: 1.3 10*3/uL (ref 0.7–4.0)
MCH: 30.5 pg (ref 26.0–34.0)
MCHC: 32.8 g/dL (ref 30.0–36.0)
MCV: 93 fL (ref 80.0–100.0)
Monocytes Absolute: 0.7 10*3/uL (ref 0.1–1.0)
Monocytes Relative: 13 %
Neutro Abs: 3.4 10*3/uL (ref 1.7–7.7)
Neutrophils Relative %: 61 %
Platelets: 239 10*3/uL (ref 150–400)
RBC: 3.71 MIL/uL — ABNORMAL LOW (ref 4.22–5.81)
RDW: 14.6 % (ref 11.5–15.5)
WBC: 5.6 10*3/uL (ref 4.0–10.5)
nRBC: 0 % (ref 0.0–0.2)

## 2020-05-20 LAB — URINALYSIS, ROUTINE W REFLEX MICROSCOPIC
Bilirubin Urine: NEGATIVE
Glucose, UA: NEGATIVE mg/dL
Ketones, ur: NEGATIVE mg/dL
Leukocytes,Ua: NEGATIVE
Nitrite: NEGATIVE
Protein, ur: NEGATIVE mg/dL
Specific Gravity, Urine: 1.014 (ref 1.005–1.030)
pH: 5 (ref 5.0–8.0)

## 2020-05-20 LAB — APTT: aPTT: 39 seconds — ABNORMAL HIGH (ref 24–36)

## 2020-05-20 LAB — BASIC METABOLIC PANEL
Anion gap: 8 (ref 5–15)
BUN: 30 mg/dL — ABNORMAL HIGH (ref 8–23)
CO2: 25 mmol/L (ref 22–32)
Calcium: 9.6 mg/dL (ref 8.9–10.3)
Chloride: 101 mmol/L (ref 98–111)
Creatinine, Ser: 0.9 mg/dL (ref 0.61–1.24)
GFR, Estimated: >60 mL/min (ref >60)
Glucose, Bld: 113 mg/dL — ABNORMAL HIGH (ref 70–99)
Potassium: 3.8 mmol/L (ref 3.5–5.1)
Sodium: 134 mmol/L — ABNORMAL LOW (ref 135–145)

## 2020-05-20 LAB — RESP PANEL BY RT-PCR (FLU A&B, COVID) ARPGX2
Influenza A by PCR: NEGATIVE
Influenza B by PCR: NEGATIVE
SARS Coronavirus 2 by RT PCR: NEGATIVE

## 2020-05-20 LAB — MRSA PCR SCREENING: MRSA by PCR: NEGATIVE

## 2020-05-20 LAB — PROTIME-INR
INR: 1.2 (ref 0.8–1.2)
Prothrombin Time: 14.4 seconds (ref 11.4–15.2)

## 2020-05-20 MED ORDER — SACUBITRIL-VALSARTAN 49-51 MG PO TABS
1.0000 | ORAL_TABLET | Freq: Every day | ORAL | Status: DC
  Administered 2020-05-20 – 2020-05-21 (×2): 1 via ORAL
  Filled 2020-05-20 (×2): qty 1

## 2020-05-20 MED ORDER — HYDRALAZINE HCL 20 MG/ML IJ SOLN: 10.0000 mg | INTRAMUSCULAR | Status: DC | PRN

## 2020-05-20 MED ORDER — HYDROCODONE-ACETAMINOPHEN 5-325 MG PO TABS
1.0000 | ORAL_TABLET | Freq: Once | ORAL | Status: AC
  Administered 2020-05-20: 1 via ORAL
  Filled 2020-05-20: qty 1

## 2020-05-20 MED ORDER — HEPARIN (PORCINE) 25000 UT/250ML-% IV SOLN
1150.0000 [IU]/h | INTRAVENOUS | Status: DC
  Administered 2020-05-20 – 2020-05-21 (×2): 1150 [IU]/h via INTRAVENOUS
  Filled 2020-05-20 (×2): qty 250

## 2020-05-20 MED ORDER — SODIUM CHLORIDE 0.9 % IV SOLN: INTRAVENOUS | Status: DC

## 2020-05-20 MED ORDER — ALBUTEROL SULFATE (2.5 MG/3ML) 0.083% IN NEBU: 2.5000 mg | INHALATION_SOLUTION | Freq: Four times a day (QID) | RESPIRATORY_TRACT | Status: DC | PRN

## 2020-05-20 MED ORDER — HEPARIN (PORCINE) 25000 UT/250ML-% IV SOLN: 1150.0000 [IU]/h | INTRAVENOUS | Status: DC

## 2020-05-20 MED ORDER — DEXAMETHASONE SODIUM PHOSPHATE 4 MG/ML IJ SOLN
4.0000 mg | Freq: Three times a day (TID) | INTRAMUSCULAR | Status: DC
  Administered 2020-05-21 – 2020-05-22 (×5): 4 mg via INTRAVENOUS
  Filled 2020-05-20 (×6): qty 1

## 2020-05-20 MED ORDER — CARVEDILOL 6.25 MG PO TABS
6.2500 mg | ORAL_TABLET | Freq: Two times a day (BID) | ORAL | Status: DC
  Administered 2020-05-20 – 2020-05-28 (×15): 6.25 mg via ORAL
  Filled 2020-05-20 (×15): qty 1

## 2020-05-20 MED ORDER — ACETAMINOPHEN 325 MG PO TABS: 650.0000 mg | ORAL_TABLET | Freq: Four times a day (QID) | ORAL | Status: DC | PRN

## 2020-05-20 MED ORDER — SODIUM CHLORIDE 0.9% FLUSH
3.0000 mL | Freq: Two times a day (BID) | INTRAVENOUS | Status: DC
  Administered 2020-05-20 – 2020-05-22 (×5): 3 mL via INTRAVENOUS

## 2020-05-20 MED ORDER — ONDANSETRON HCL 4 MG PO TABS: 4.0000 mg | ORAL_TABLET | Freq: Four times a day (QID) | ORAL | Status: DC | PRN

## 2020-05-20 MED ORDER — HEPARIN BOLUS VIA INFUSION
2000.0000 [IU] | Freq: Once | INTRAVENOUS | Status: AC
  Administered 2020-05-20: 2000 [IU] via INTRAVENOUS
  Filled 2020-05-20: qty 2000

## 2020-05-20 MED ORDER — ONDANSETRON HCL 4 MG/2ML IJ SOLN: 4.0000 mg | Freq: Four times a day (QID) | INTRAMUSCULAR | Status: DC | PRN

## 2020-05-20 MED ORDER — GADOBUTROL 1 MMOL/ML IV SOLN
7.5000 mL | Freq: Once | INTRAVENOUS | Status: AC | PRN
  Administered 2020-05-20: 7.5 mL via INTRAVENOUS

## 2020-05-20 MED ORDER — ACETAMINOPHEN 650 MG RE SUPP: 650.0000 mg | Freq: Four times a day (QID) | RECTAL | Status: DC | PRN

## 2020-05-20 MED ORDER — DEXAMETHASONE SODIUM PHOSPHATE 10 MG/ML IJ SOLN
10.0000 mg | Freq: Once | INTRAMUSCULAR | Status: AC
  Administered 2020-05-20: 10 mg via INTRAVENOUS
  Filled 2020-05-20: qty 1

## 2020-05-20 MED ORDER — SODIUM CHLORIDE 0.9 % IV BOLUS
250.0000 mL | Freq: Once | INTRAVENOUS | Status: AC
  Administered 2020-05-20: 250 mL via INTRAVENOUS

## 2020-05-20 NOTE — ED Notes (Signed)
Pt ambulated with walker and stand by assist for safety. Pt tolerated well and thought the walker was better than using personal crutches.

## 2020-05-20 NOTE — ED Notes (Signed)
[facility] called for pt transport. Spoke with Maudie Mercury.

## 2020-05-20 NOTE — Progress Notes (Signed)
Patient hypotensive with BP of 88/49. Awake alert in bed eating meal. Denies SOB, dizziness, chest pain or any distress. Paged Dr. Tamala Julian order given for 250 ml bolus of NS. Will reassess vitals when bolus complete.
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
## 2020-05-20 NOTE — ED Provider Notes (Signed)
Pt transferred from [facility] for MRI. See Dr. Soledad Gerlach note for full H&P.   Per his note, "Patient presents to the emergency department for evaluation after a fall.  Patient reports that he has bone cancer in his right hip and this has caused him to be unsteady on his feet.  He lost his balance today and fell, landing on his backside and then hit his head on the ground.  He has a headache but did not lose consciousness.  No numbness, tingling or weakness in the lower extremities.  No change in bowel or bladder function.  He is not experiencing any increased pain in the back or hip after the fall. " pt has a h/o bone CA and had a fall. He is c/o leg weakness. He spoke with neurology    Physical Exam  BP (!) 172/100   Pulse 86   Temp 98 F (36.7 C) (Oral)   Resp 15   Ht 5\' 10"  (1.778 m)   Wt 82.6 kg   SpO2 99%   BMI 26.11 kg/m   Physical Exam  ED Course/Procedures     Procedures  Results for orders placed or performed during the hospital encounter of 05/19/20  Resp Panel by RT-PCR (Flu A&B, Covid) Nasopharyngeal Swab   Specimen: Nasopharyngeal Swab; Nasopharyngeal(NP) swabs in vial transport medium  Result Value Ref Range   SARS Coronavirus 2 by RT PCR NEGATIVE NEGATIVE   Influenza A by PCR NEGATIVE NEGATIVE   Influenza B by PCR NEGATIVE NEGATIVE  Urinalysis, Routine w reflex microscopic Urine, Clean Catch  Result Value Ref Range   Color, Urine YELLOW YELLOW   APPearance CLEAR CLEAR   Specific Gravity, Urine 1.014 1.005 - 1.030   pH 5.0 5.0 - 8.0   Glucose, UA NEGATIVE NEGATIVE mg/dL   Hgb urine dipstick MODERATE (A) NEGATIVE   Bilirubin Urine NEGATIVE NEGATIVE   Ketones, ur NEGATIVE NEGATIVE mg/dL   Protein, ur NEGATIVE NEGATIVE mg/dL   Nitrite NEGATIVE NEGATIVE   Leukocytes,Ua NEGATIVE NEGATIVE   RBC / HPF 6-10 0 - 5 RBC/hpf   WBC, UA 0-5 0 - 5 WBC/hpf   Bacteria, UA RARE (A) NONE SEEN   Squamous Epithelial / LPF 0-5 0 - 5   Mucus PRESENT    Hyaline  Casts, UA PRESENT   CBC with Differential/Platelet  Result Value Ref Range   WBC 5.6 4.0 - 10.5 K/uL   RBC 3.71 (L) 4.22 - 5.81 MIL/uL   Hemoglobin 11.3 (L) 13.0 - 17.0 g/dL   HCT 34.5 (L) 39 - 52 %   MCV 93.0 80.0 - 100.0 fL   MCH 30.5 26.0 - 34.0 pg   MCHC 32.8 30.0 - 36.0 g/dL   RDW 14.6 11.5 - 15.5 %   Platelets 239 150 - 400 K/uL   nRBC 0.0 0.0 - 0.2 %   Neutrophils Relative % 61 %   Neutro Abs 3.4 1.7 - 7.7 K/uL   Lymphocytes Relative 24 %   Lymphs Abs 1.3 0.7 - 4.0 K/uL   Monocytes Relative 13 %   Monocytes Absolute 0.7 0.1 - 1.0 K/uL   Eosinophils Relative 1 %   Eosinophils Absolute 0.1 0.0 - 0.5 K/uL   Basophils Relative 1 %   Basophils Absolute 0.0 0.0 - 0.1 K/uL   Immature Granulocytes 0 %   Abs Immature Granulocytes 0.01 0.00 - 0.07 K/uL  Basic metabolic panel  Result Value Ref Range   Sodium 134 (L) 135 - 145 mmol/L  Potassium 3.8 3.5 - 5.1 mmol/L   Chloride 101 98 - 111 mmol/L   CO2 25 22 - 32 mmol/L   Glucose, Bld 113 (H) 70 - 99 mg/dL   BUN 30 (H) 8 - 23 mg/dL   Creatinine, Ser 0.90 0.61 - 1.24 mg/dL   Calcium 9.6 8.9 - 10.3 mg/dL   GFR, Estimated >60 >60 mL/min   Anion gap 8 5 - 15  Protime-INR  Result Value Ref Range   Prothrombin Time 14.4 11.4 - 15.2 seconds   INR 1.2 0.8 - 1.2   DG Lumbar Spine Complete  Result Date: 05/19/2020 CLINICAL DATA:  Right hip and low back pain after a fall EXAM: LUMBAR SPINE - COMPLETE 4+ VIEW COMPARISON:  09/18/2019 FINDINGS: Mild lumbar scoliosis convex towards the right, unchanged. No anterior subluxation. Diffuse bone demineralization. Compression of the superior endplates at Z32, L1, and L2 is unchanged since prior study, likely indicating osteoporosis. No new vertebral compression. Degenerative changes with narrowed interspaces and endplate hypertrophic change. Degenerative changes in the lumbar facet joints. No focal bone lesion or bone destruction. Aortic vascular calcifications. IMPRESSION: Mild scoliosis and  degenerative changes. Diffuse bone demineralization. No acute compression. Electronically Signed   By: Lucienne Capers M.D.   On: 05/19/2020 23:52   CT HEAD WO CONTRAST  Result Date: 05/19/2020 CLINICAL DATA:  Status post fall. EXAM: CT HEAD WITHOUT CONTRAST TECHNIQUE: Contiguous axial images were obtained from the base of the skull through the vertex without intravenous contrast. COMPARISON:  None. FINDINGS: Brain: There is mild cerebral atrophy with widening of the extra-axial spaces and ventricular dilatation. There are areas of decreased attenuation within the white matter tracts of the supratentorial brain, consistent with microvascular disease changes. Vascular: No hyperdense vessel or unexpected calcification. Skull: Normal. Negative for fracture or focal lesion. Sinuses/Orbits: There is marked severity frontal, sphenoid, bilateral ethmoid and left maxillary sinus mucosal thickening. Mild to moderate severity right maxillary sinus mucosal thickening is also seen. Other: None. IMPRESSION: 1. Mild cerebral atrophy and microvascular disease changes of the supratentorial brain. 2. No acute intracranial abnormality. 3. Marked severity pansinus disease. Electronically Signed   By: Virgina Norfolk M.D.   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 No intravenous contrast was administered. COMPARISON:  10/05/2011 cervical spine radiographs. FINDINGS: Alignment: Grade 1 C3-4 and C4-5 anterolisthesis. Grade 1 C7-T1 anterolisthesis. Vertebrae: Vertebral body heights are preserved. Diffuse bone marrow heterogeneity. Ill-defined STIR hyperintense signal involving the anterior C4 vertebral body. Cord: Normal signal and morphology. Posterior Fossa, vertebral arteries: Negative. Disc levels:  Multilevel desiccation and disc space loss. Partial fusion at the C5-6 level. C2-3: No significant disc bulge. Uncovertebral and facet degenerative spurring. Patent spinal canal and neural foramen. C3-4: Disc osteophyte complex with superimposed central protrusion. Uncovertebral and facet hypertrophy. Patent spinal canal. Mild bilateral neural foraminal narrowing. C4-5: Disc osteophyte complex with right predominant uncovertebral and facet hypertrophy. Shallow central protrusion. Patent spinal canal and left neural foramen. Moderate to severe right neural foraminal narrowing. C5-6: Disc osteophyte complex partially effacing the ventral CSF containing spaces with uncovertebral and facet hypertrophy. Prominent ligamentum flavum. Mild spinal canal and moderate bilateral neural foraminal narrowing. C6-7: Disc osteophyte complex with uncovertebral and facet hypertrophy. Prominent ligamentum flavum. Mild spinal canal, mild left and moderate right neural foraminal narrowing. C7-T1: Minimal  grade 1 anterolisthesis with uncovered posterior bulge. Patent spinal canal and neural foramen. Paraspinal tissues: Negative. IMPRESSION: 1. Anterior C4 vertebral body STIR hyperintense signal is concerning for osseous metastases. No fracture. Consider postcontrast imaging for further evaluation. 2. Multilevel spondylosis as detailed above. 3. Mild C5-6 and C6-7 spinal canal narrowing. 4. Moderate to severe right C4-5, bilateral C5-6 and right C6-7 neural foraminal narrowing. Electronically Signed   By: Primitivo Gauze M.D.   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 No intravenous contrast was administered. COMPARISON:  Concurrent MRI thoracic spine. 05/19/2020 lumbar  spine radiographs. FINDINGS: Segmentation:  Standard. Alignment: Minimal grade 1 L3-4 retrolisthesis. Straightening of lordosis. Vertebrae: Chronic L1 posttraumatic deformity. No retropulsion. Mild multilevel Modic type 2 endplate degenerative changes. Partially imaged left sacral ala osseous metastases measuring 5.0 x 4.6 cm (2:14). No discrete acute fracture. STIR hyperintense signal underlying the posterior inferior L1 endplate. Conus medullaris and cauda equina: Conus extends to the L1 level. Conus and cauda equina appear normal. Disc levels: Multilevel desiccation and disc space loss. Multilevel endplate degenerative spurring. L1-2: No significant disc bulge. Bilateral facet degenerative spurring. Patent spinal canal and neural foramen. L2-3: Mild disc bulge with superimposed right foraminal protrusion. Facet degenerative spurring. Patent spinal canal and neural foramen. L3-4: Mild disc bulge and bilateral facet degenerative spurring. Patent spinal canal. Mild bilateral neural foraminal narrowing. L4-5: Disc bulge with shallow right foraminal and central protrusions. Facet degenerative spurring. Patent spinal canal. Mild bilateral neural foraminal narrowing. L5-S1: Minimal disc bulge and bilateral facet hypertrophy. Patent spinal canal and right neural foramen. Mild left neural foraminal narrowing. Paraspinal and other soft tissues: Bilateral renal cysts. IMPRESSION: 1. Partially imaged left sacral ala osseous metastases measuring 5.0 x 4.6 cm. Consider MRI pelvis with and without contrast for better evaluation. 2. STIR hyperintense signal underlying the dorsal inferior endplate of L1, degenerative versus small osseous metastasis. No discrete acute fracture. 3. Multilevel spondylosis as detailed above. Patent spinal canal. Multilevel mild neural foraminal narrowing. Electronically Signed   By: Primitivo Gauze M.D.   On: 05/20/2020 09:58   MR THORACIC SPINE W WO CONTRAST  Result Date:  05/20/2020 CLINICAL DATA:  Mid-back pain leg weakness, h/o prostate cancer to bone EXAM: MRI THORACIC WITHOUT AND WITH CONTRAST TECHNIQUE: Multiplanar and multiecho pulse sequences of the thoracic spine were obtained without and with intravenous contrast. CONTRAST:  7.33mL GADAVIST GADOBUTROL 1 MMOL/ML IV SOLN COMPARISON:  Concurrent MRI of the cervical and lumbar spine. 10/05/2011 thoracic spine radiographs. FINDINGS: MRI THORACIC SPINE FINDINGS Alignment:  Normal. Vertebrae: Chronic L1 posttraumatic deformity with mild height loss. Thoracic vertebral body heights are preserved. Abnormal STIR hyperintense bone marrow signal at the T6-T8 levels involving the posterior elements. No discrete fracture. Cord: Normal cord morphology. Mild T2 hyperintense signal at the T8 level (19:9) without focal enhancement. Paraspinal and other soft tissues: Discussed below. Disc levels: Enhancing posterior epidural soft tissue measuring 8.2 by 1.3 cm spanning the T6-T8 levels (19:8). The soft tissue demonstrates extension into the lateral epidural spaces with moderate to severe spinal canal narrowing. There is extension of abnormal enhancing soft tissue into the para and prevertebral spaces at the T6-T9 levels. Abnormal enhancing soft tissue extends laterally to encase the left greater than right ribs at these levels. IMPRESSION: Epidural  soft tissue metastases at the T6-T9 levels extending into the pre/paravertebral space with encasement of the left greater than right proximal ribs. Moderate to severe spinal canal narrowing at the T6-9 levels secondary to soft tissue encasement. Mild T2 hyperintense cord signal at the T8 level may reflect edema. Osseous metastatic involvement of the T6-T8 vertebral bodies without discrete pathologic fracture. Electronically Signed   By: Primitivo Gauze M.D.   On: 05/20/2020 09:50   DG Hip Unilat W or Wo Pelvis 2-3 Views Right  Result Date: 05/19/2020 CLINICAL DATA:  Right hip and low back  pain after a fall this evening. EXAM: DG HIP (WITH OR WITHOUT PELVIS) 2-3V RIGHT COMPARISON:  None. FINDINGS: No evidence of acute fracture or dislocation involving the pelvis or right hip. SI joints and symphysis pubis are nondisplaced. Narrowing and sclerosis in the SI joints may represent partial ankylosis or degenerative change. No focal bone lesion or bone destruction. Soft tissues are unremarkable. IMPRESSION: No acute bony abnormalities. Electronically Signed   By: Lucienne Capers M.D.   On: 05/19/2020 23:50   CRITICAL CARE Performed by: Rodney Booze   Total critical care time: 38 minutes  Critical care time was exclusive of separately billable procedures and treating other patients.  Critical care was necessary to treat or prevent imminent or life-threatening deterioration.  Critical care was time spent personally by me on the following activities: development of treatment plan with patient and/or surrogate as well as nursing, discussions with consultants, evaluation of patient's response to treatment, examination of patient, obtaining history from patient or surrogate, ordering and performing treatments and interventions, ordering and review of laboratory studies, ordering and review of radiographic studies, pulse oximetry and re-evaluation of patient's condition.    MDM    Briefly, pt with history of prostate cancer with bony spread to the pelvis presenting after fall with head trauma.  Also complaining of progressive bilateral lower extremity weakness over the last several days.  Transferred to [facility] for MRIs of the cervical, thoracic and lumbar spine due to concern for possible bony metastases.    MRI cervical Spine - Anterior C4 vertebral body STIR hyperintense signal is concerning for osseous metastases. No fracture. Consider postcontrast imaging for further evaluation. MR thoracic spine - Epidural soft tissue metastases at the T6-T9 levels extending into the  pre/paravertebral space with encasement of the left greater than right proximal ribs. Moderate to severe spinal canal narrowing at the T6-9 levels secondary to soft tissue encasement. Mild T2 hyperintense cord signal at the T8 level may reflect edema. Osseous metastatic involvement of the T6-T8 vertebral bodies without discrete pathologic fracture. MR lumbar spine - Partially imaged left sacral ala osseous metastases measuring 5.0 x 4.6 cm. Consider MRI pelvis with and without contrast for better Evaluation. 2. STIR hyperintense signal underlying the dorsal inferior endplate of L1, degenerative versus small osseous metastasis. No discrete acute fracture.  10:51 AM CONSULT with Ples Specter, APP with neurosurgery who will review the imaging and call back.   11:23 AM Supervising physician, Dr. Kathrynn Humble, CONSULTED with Dr. Lisbeth Renshaw who recommended palliative radiation unless patient can be decompressed with neurosurgery more expeditiously.  12:30 PM Dr. Ronnald Ramp with neurosurgery at bedside to eval pt. He spoke with pt and family at bedside about surgical options.  12:54 PM Dr. Ronnald Ramp discussed with pt and family at bedside about surgical options. Pt would like to proceed with surgery.   12:57 PM Dr. Kathrynn Humble, supervising physician consulted with palliative care who will see the patient.  1:20 PM CONSULT with Dr. Posey Pronto who will admit the patient to the hospitalist service.     Rodney Booze, PA-C 05/20/20 [place], [place], MD 05/21/20 [phone]

## 2020-05-20 NOTE — ED Notes (Signed)
[facility] picking up patient and taking him to [facility] ED.
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
## 2020-05-20 NOTE — ED Notes (Signed)
Ambulated pt: Pt states again that his knees are weak Pt states that his knees have become weaker in the last few days Pt needs 2-person assist to ambulate Pt states he walks with 2 crutches at home Pt family member states she has bought pt a walker for ambulation

## 2020-05-20 NOTE — ED Notes (Signed)
Staples removed from pt back of head. Dermabond applied due to MRI.

## 2020-05-20 NOTE — ED Triage Notes (Signed)
Pt BIB [facility] from [place] due needing an IV.Per [facility] pt was brought to [place] due to fall in the shower.Pt denies any LOC.Pt reports he is on blood thinner (eliqus). Pt reports bone cancer and recently been having a hard time walking.Pt had CT at [facility] it was negative. Pt is A&Ox4 on arrival. Pt has lac to the back of the head. 3 staples was placed at [place].

## 2020-05-20 NOTE — ED Notes (Signed)
Pt family member at bedside Per family member, pt has been having significant decrease in mobility over past 2 weeks Asked to have pt ambulate in hallway Pt states knees are too weak Pt states he walks with 2 crutches and pt family member said she has bought pt a walker
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
## 2020-05-20 NOTE — Progress Notes (Signed)
Patient BP improved to 103/59 MD notified via text page.

## 2020-05-20 NOTE — ED Notes (Signed)
Pt has requested to use urinal x2 with little urinae output but continues to have urge to urinate. Bladder scan done and only has 46cc of urine in bladder. Pa made aware. Sample retained at bedside.

## 2020-05-20 NOTE — ED Notes (Signed)
[facility] present for pt transfer

## 2020-05-21 ENCOUNTER — Inpatient Hospital Stay (HOSPITAL_COMMUNITY): Payer: Medicare Other

## 2020-05-21 DIAGNOSIS — D649 Anemia, unspecified: Secondary | ICD-10-CM | POA: Diagnosis not present

## 2020-05-21 DIAGNOSIS — I4819 Other persistent atrial fibrillation: Secondary | ICD-10-CM | POA: Diagnosis not present

## 2020-05-21 DIAGNOSIS — G9529 Other cord compression: Secondary | ICD-10-CM | POA: Diagnosis not present

## 2020-05-21 DIAGNOSIS — I160 Hypertensive urgency: Secondary | ICD-10-CM | POA: Diagnosis not present

## 2020-05-21 LAB — CBC
HCT: 38.6 % — ABNORMAL LOW (ref 39.0–52.0)
Hemoglobin: 12.7 g/dL — ABNORMAL LOW (ref 13.0–17.0)
MCH: 30.3 pg (ref 26.0–34.0)
MCHC: 32.9 g/dL (ref 30.0–36.0)
MCV: 92.1 fL (ref 80.0–100.0)
Platelets: 269 10*3/uL (ref 150–400)
RBC: 4.19 MIL/uL — ABNORMAL LOW (ref 4.22–5.81)
RDW: 14.4 % (ref 11.5–15.5)
WBC: 3.5 10*3/uL — ABNORMAL LOW (ref 4.0–10.5)
nRBC: 0 % (ref 0.0–0.2)

## 2020-05-21 LAB — COMPREHENSIVE METABOLIC PANEL
ALT: 17 U/L (ref 0–44)
AST: 26 U/L (ref 15–41)
Albumin: 3.7 g/dL (ref 3.5–5.0)
Alkaline Phosphatase: 68 U/L (ref 38–126)
Anion gap: 13 (ref 5–15)
BUN: 18 mg/dL (ref 8–23)
CO2: 24 mmol/L (ref 22–32)
Calcium: 9.4 mg/dL (ref 8.9–10.3)
Chloride: 101 mmol/L (ref 98–111)
Creatinine, Ser: 0.78 mg/dL (ref 0.61–1.24)
GFR, Estimated: >60 mL/min (ref >60)
Glucose, Bld: 168 mg/dL — ABNORMAL HIGH (ref 70–99)
Potassium: 3.6 mmol/L (ref 3.5–5.1)
Sodium: 138 mmol/L (ref 135–145)
Total Bilirubin: 0.8 mg/dL (ref 0.3–1.2)
Total Protein: 7.1 g/dL (ref 6.5–8.1)

## 2020-05-21 LAB — HEPARIN LEVEL (UNFRACTIONATED): Heparin Unfractionated: 1.54 IU/mL — ABNORMAL HIGH (ref 0.30–0.70)

## 2020-05-21 LAB — APTT
aPTT: 67 seconds — ABNORMAL HIGH (ref 24–36)
aPTT: 72 seconds — ABNORMAL HIGH (ref 24–36)

## 2020-05-21 LAB — MAGNESIUM: Magnesium: 2 mg/dL (ref 1.7–2.4)

## 2020-05-21 MED ORDER — HYDROCODONE-ACETAMINOPHEN 5-325 MG PO TABS: 1.0000 | ORAL_TABLET | ORAL | Status: DC | PRN

## 2020-05-21 MED ORDER — TRAMADOL HCL 50 MG PO TABS
50.0000 mg | ORAL_TABLET | Freq: Four times a day (QID) | ORAL | Status: DC | PRN
  Administered 2020-05-23: 50 mg via ORAL
  Filled 2020-05-21: qty 1

## 2020-05-21 MED ORDER — GADOBUTROL 1 MMOL/ML IV SOLN
8.5000 mL | Freq: Once | INTRAVENOUS | Status: AC | PRN
  Administered 2020-05-21: 8.5 mL via INTRAVENOUS

## 2020-05-21 NOTE — Progress Notes (Signed)
Subjective: Patient reports doing well, no acute events overnight.   Objective: Vital signs in last 24 hours: Temp:  [97.5 F (36.4 C)-97.9 F (36.6 C)] 97.5 F (36.4 C) (11/26 0419) Pulse Rate:  [64-97] 64 (11/25 1909) Resp:  [12-21] 14 (11/25 1909) BP: (88-191)/(49-147) 175/97 (11/26 0419) SpO2:  [90 %-100 %] 99 % (11/26 0419)  Intake/Output from previous day: 11/25 0701 - 11/26 0700 In: 641.7 [I.V.:641.7] Out: 1801 [Urine:1800; Stool:1] Intake/Output this shift: No intake/output data recorded.  Neurologic: Grossly normal  Lab Results: Lab Results  Component Value Date   WBC 3.5 (L) 05/21/2020   HGB 12.7 (L) 05/21/2020   HCT 38.6 (L) 05/21/2020   MCV 92.1 05/21/2020   PLT 269 05/21/2020   Lab Results  Component Value Date   INR 1.2 05/20/2020   BMET Lab Results  Component Value Date   NA 138 05/21/2020   K 3.6 05/21/2020   CL 101 05/21/2020   CO2 24 05/21/2020   GLUCOSE 168 (H) 05/21/2020   BUN 18 05/21/2020   CREATININE 0.78 05/21/2020   CALCIUM 9.4 05/21/2020    Studies/Results: DG Lumbar Spine Complete  Result Date: 05/19/2020 CLINICAL DATA:  Right hip and low back pain after a fall EXAM: LUMBAR SPINE - COMPLETE 4+ VIEW COMPARISON:  09/18/2019 FINDINGS: Mild lumbar scoliosis convex towards the right, unchanged. No anterior subluxation. Diffuse bone demineralization. Compression of the superior endplates at G26, L1, and L2 is unchanged since prior study, likely indicating osteoporosis. No new vertebral compression. Degenerative changes with narrowed interspaces and endplate hypertrophic change. Degenerative changes in the lumbar facet joints. No focal bone lesion or bone destruction. Aortic vascular calcifications. IMPRESSION: Mild scoliosis and degenerative changes. Diffuse bone demineralization. No acute compression. Electronically Signed   By: Lucienne Capers M.D.   On: 05/19/2020 23:52   CT HEAD WO CONTRAST  Result Date: 05/19/2020 CLINICAL DATA:   Status post fall. EXAM: CT HEAD WITHOUT CONTRAST TECHNIQUE: Contiguous axial images were obtained from the base of the skull through the vertex without intravenous contrast. COMPARISON:  None. FINDINGS: Brain: There is mild cerebral atrophy with widening of the extra-axial spaces and ventricular dilatation. There are areas of decreased attenuation within the white matter tracts of the supratentorial brain, consistent with microvascular disease changes. Vascular: No hyperdense vessel or unexpected calcification. Skull: Normal. Negative for fracture or focal lesion. Sinuses/Orbits: There is marked severity frontal, sphenoid, bilateral ethmoid and left maxillary sinus mucosal thickening. Mild to moderate severity right maxillary sinus mucosal thickening is also seen. Other: None. IMPRESSION: 1. Mild cerebral atrophy and microvascular disease changes of the supratentorial brain. 2. No acute intracranial abnormality. 3. Marked severity pansinus disease. Electronically Signed   By: Virgina Norfolk M.D.   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 No intravenous contrast was administered. COMPARISON:  10/05/2011 cervical spine radiographs. FINDINGS: Alignment: Grade 1 C3-4 and C4-5 anterolisthesis. Grade 1 C7-T1 anterolisthesis. Vertebrae: Vertebral body heights are preserved. Diffuse bone marrow heterogeneity. Ill-defined STIR hyperintense signal involving the anterior C4 vertebral body. Cord: Normal signal and morphology. Posterior Fossa, vertebral arteries: Negative. Disc levels: Multilevel desiccation and disc space loss. Partial fusion at the C5-6 level. C2-3: No significant disc bulge. Uncovertebral and facet degenerative spurring. Patent spinal canal and neural foramen. C3-4: Disc osteophyte  complex with superimposed central  protrusion. Uncovertebral and facet hypertrophy. Patent spinal canal. Mild bilateral neural foraminal narrowing. C4-5: Disc osteophyte complex with right predominant uncovertebral and facet hypertrophy. Shallow central protrusion. Patent spinal canal and left neural foramen. Moderate to severe right neural foraminal narrowing. C5-6: Disc osteophyte complex partially effacing the ventral CSF containing spaces with uncovertebral and facet hypertrophy. Prominent ligamentum flavum. Mild spinal canal and moderate bilateral neural foraminal narrowing. C6-7: Disc osteophyte complex with uncovertebral and facet hypertrophy. Prominent ligamentum flavum. Mild spinal canal, mild left and moderate right neural foraminal narrowing. C7-T1: Minimal grade 1 anterolisthesis with uncovered posterior bulge. Patent spinal canal and neural foramen. Paraspinal tissues: Negative. IMPRESSION: 1. Anterior C4 vertebral body STIR hyperintense signal is concerning for osseous metastases. No fracture. Consider postcontrast imaging for further evaluation. 2. Multilevel spondylosis as detailed above. 3. Mild C5-6 and C6-7 spinal canal narrowing. 4. Moderate to severe right C4-5, bilateral C5-6 and right C6-7 neural foraminal narrowing. Electronically Signed   By: Primitivo Gauze M.D.   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 No intravenous contrast was administered. COMPARISON:  Concurrent MRI thoracic spine. 05/19/2020 lumbar spine radiographs. FINDINGS: Segmentation:  Standard. Alignment: Minimal grade 1 L3-4 retrolisthesis. Straightening of lordosis. Vertebrae: Chronic L1 posttraumatic deformity. No retropulsion. Mild multilevel Modic type 2  endplate degenerative changes. Partially imaged left sacral ala osseous metastases measuring 5.0 x 4.6 cm (2:14). No discrete acute fracture. STIR hyperintense signal underlying the posterior inferior L1 endplate. Conus medullaris and cauda equina: Conus extends to the L1 level. Conus and cauda equina appear normal. Disc levels: Multilevel desiccation and disc space loss. Multilevel endplate degenerative spurring. L1-2: No significant disc bulge. Bilateral facet degenerative spurring. Patent spinal canal and neural foramen. L2-3: Mild disc bulge with superimposed right foraminal protrusion. Facet degenerative spurring. Patent spinal canal and neural foramen. L3-4: Mild disc bulge and bilateral facet degenerative spurring. Patent spinal canal. Mild bilateral neural foraminal narrowing. L4-5: Disc bulge with shallow right foraminal and central protrusions. Facet degenerative spurring. Patent spinal canal. Mild bilateral neural foraminal narrowing. L5-S1: Minimal disc bulge and bilateral facet hypertrophy. Patent spinal canal and right neural foramen. Mild left neural foraminal narrowing. Paraspinal and other soft tissues: Bilateral renal cysts. IMPRESSION: 1. Partially imaged left sacral ala osseous metastases measuring 5.0 x 4.6 cm. Consider MRI pelvis with and without contrast for better evaluation. 2. STIR hyperintense signal underlying the dorsal inferior endplate of L1, degenerative versus small osseous metastasis. No discrete acute fracture. 3. Multilevel spondylosis as detailed above. Patent spinal canal. Multilevel mild neural foraminal narrowing. Electronically Signed   By: Primitivo Gauze M.D.   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 CONTRAST:  7.14mL GADAVIST GADOBUTROL 1 MMOL/ML IV SOLN COMPARISON:  Concurrent MRI of the cervical and lumbar spine. 10/05/2011 thoracic spine radiographs. FINDINGS: MRI THORACIC SPINE FINDINGS Alignment:  Normal. Vertebrae: Chronic L1 posttraumatic deformity with mild height loss. Thoracic vertebral body heights are preserved. Abnormal STIR hyperintense bone marrow signal at the T6-T8 levels involving the posterior elements. No discrete fracture.  Cord: Normal cord morphology. Mild T2 hyperintense signal at the T8 level (19:9) without focal enhancement. Paraspinal and other soft tissues: Discussed below. Disc levels: Enhancing posterior epidural soft tissue measuring 8.2 by 1.3 cm spanning the T6-T8 levels (19:8). The soft tissue demonstrates extension into the lateral epidural spaces with moderate to severe spinal canal narrowing. There is extension of abnormal enhancing soft tissue into the para and prevertebral spaces at the T6-T9 levels. Abnormal enhancing soft tissue extends laterally to encase the left greater than right ribs at these levels. IMPRESSION: Epidural soft tissue metastases at the T6-T9 levels extending into the pre/paravertebral space with encasement of the left greater than right proximal ribs. Moderate to severe spinal canal narrowing at the T6-9 levels secondary to soft tissue encasement. Mild T2 hyperintense cord signal at the T8 level may reflect edema. Osseous metastatic involvement of the T6-T8 vertebral bodies without discrete pathologic fracture. Electronically Signed   By: Primitivo Gauze M.D.   On: 05/20/2020 09:50   MR PELVIS W WO CONTRAST  Result Date: 05/21/2020 CLINICAL DATA:  History of prostate cancer. EXAM: MRI PELVIS WITHOUT AND WITH CONTRAST TECHNIQUE: Multiplanar multisequence MR imaging of the pelvis was performed both before and after administration of intravenous contrast. CONTRAST:  8.33mL GADAVIST GADOBUTROL 1 MMOL/ML IV SOLN COMPARISON:   Chest abdomen and pelvis CT from March of 2021 FINDINGS: Urinary Tract: Urinary bladder is distended and trabeculated suggesting bladder outlet obstruction or neurogenic bladder. No distal ureteral dilation. Cysts of the LEFT kidney partially visualized. RIGHT renal cysts also partially visualized. Excreted contrast perhaps from the lumbar spine evaluation in the urinary bladder. Bowel: Visualized gastrointestinal tract without acute process. Bowel is incompletely imaged on the current exam is and study was not performed for bowel evaluation. Colonic diverticulosis of the sigmoid. Vascular/Lymphatic: Vascular structures are grossly patent.w incompletely evaluated. Limited field of view for postcontrast images per protocol, prostate protocol was performed on the current study. Reproductive:  Prostate: Transitional zone: Nodular appearance of the transitional zone with suggestion of prior TURP. Diffuse low signal throughout the transitional zone. Concentrated gadolinium in the urinary bladder limits assessment on diffusion-weighted imaging. Peripheral zone: Diffuse low signal throughout the peripheral zone with indistinct boundary of the prostate margin at the base of the LEFT prostate extending towards the atrophic LEFT seminal vesicles. RIGHT seminal vesicles also with atrophy. Other:  No ascites. Musculoskeletal: Large metastatic lesion in the LEFT sacrum (image 1, series 9) incompletely imaged, 5.1 x 3.8 cm with respect imaged portions. Infiltration of the RIGHT acetabulum also with metastatic disease and pubic root on the RIGHT as well, metastatic lesion extending into the RIGHT iliac bone involving much of the iliac bone in the entire roof of the RIGHT acetabulum. Sequences were not performed to assess for acute process such is pathologic fracture. Study was performed as a prostate protocol. No gross lesion seen in the lower spinal canal but this was evaluated on the recent MRI of the spine, please refer to that  imaging study for further detail. Is IMPRESSION: 1. Diffuse low signal throughout the prostate. Suspicion for diffuse prostate cancer based on appearance of the prostate. PIRADS not assigned due to the presence of diffuse bony metastatic disease in the pelvis. Suspected extracapsular extension in the LEFT prostate base and potentially mid prostate. 2. RIGHT iliac, acetabular and pubic root involvement with involvement of the LEFT sacrum. Dedicated pelvic MRI for assessment of the bony pelvis may be helpful if there is clinical concern for pathologic fracture. No gross  displacement of the bony pelvis is noted though study was not performed for fracture evaluation. 3. Urinary bladder is distended and trabeculated suggesting bladder outlet obstruction or neurogenic bladder. No distal ureteral dilation. Is Electronically Signed   By: Zetta Bills M.D.   On: 05/21/2020 08:19   DG Hip Unilat W or Wo Pelvis 2-3 Views Right  Result Date: 05/19/2020 CLINICAL DATA:  Right hip and low back pain after a fall this evening. EXAM: DG HIP (WITH OR WITHOUT PELVIS) 2-3V RIGHT COMPARISON:  None. FINDINGS: No evidence of acute fracture or dislocation involving the pelvis or right hip. SI joints and symphysis pubis are nondisplaced. Narrowing and sclerosis in the SI joints may represent partial ankylosis or degenerative change. No focal bone lesion or bone destruction. Soft tissues are unremarkable. IMPRESSION: No acute bony abnormalities. Electronically Signed   By: Lucienne Capers M.D.   On: 05/19/2020 23:50    Assessment/Plan: Plan for surgical intervention on Sunday. Would like to d/c the heparin tomorrow afternoon.    LOS: 1 day    Patrick [name] 05/21/2020, 8:32 AM

## 2020-05-21 NOTE — Progress Notes (Signed)
Pt wife and granddaughter brought POA paper work in. This nurse had paperwork copied and placed in paper chart. Original returned to granddaughter. Katherina Right RN

## 2020-05-21 NOTE — Evaluation (Signed)
Occupational Therapy Evaluation Patient Details Name: Patrick [name] MRN: [id number] DOB: Feb 23, 1932 Today's Date: 05/21/2020    History of Present Illness This 86 y.o. male admitted after sustaining a fall.  he was found to have a T6-T9 mass with compression of the spinal cord.  Surgery is recommended and planned for 11/28 as pt takes Eliquis so surgery delayed.  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 He demonstrates the below listed deficits and will benefit from continued OT to maximize safety and independence with BADLs.  Pt presents to OT with impaired balance, poor safety awareness, generalized weakness, decreased activity tolerance, and impaired cognition.  He currently requires min A for LB ADLs and min A +2 for functional mobility.  He is very impulsive with poor awareness of safety and severity of his balance deficits.  He lives with his wife, who will be primary caregiver.  Anticipate he may need post acute rehab at discharge depending on progress.  Will follow.       Follow Up Recommendations  CIR;Supervision/Assistance - 24 hour    Equipment Recommendations  Tub/shower bench;3 in 1 bedside commode    Recommendations for Other Services Rehab consult     Precautions / Restrictions Precautions Precautions: Fall Precaution Comments: pt very impulsive       Mobility Bed Mobility Overal bed mobility: Needs Assistance Bed Mobility: Supine to Sit     Supine to sit: Supervision          Transfers Overall transfer level: Needs assistance Equipment used: Rolling walker (2 wheeled) Transfers: Sit to/from [employer] Sit to Stand: Min assist;+2 safety/equipment Stand pivot transfers: Min assist;+2 physical assistance;+2 safety/equipment       General transfer  comment: Pt with LOB with turns.  Requires mod cues for hand placement and walker safety     Balance Overall balance assessment: Needs assistance Sitting-balance support: Feet supported Sitting balance-Leahy Scale: Good     Standing balance support: Bilateral upper extremity supported Standing balance-Leahy Scale: Poor Standing balance comment: requires UE support                            ADL either performed or assessed with clinical judgement   ADL Overall ADL's : Needs assistance/impaired Eating/Feeding: Independent   Grooming: Wash/dry hands;Wash/dry face;Oral care;Brushing hair;Set up;Sitting   Upper Body Bathing: Set up;Sitting   Lower Body Bathing: Minimal assistance;Sit to/from stand   Upper Body Dressing : Set up;Sitting   Lower Body Dressing: Minimal assistance;Sit to/from stand   Toilet Transfer: +2 for safety/equipment;+2 for physical assistance;Minimal assistance;Ambulation;Comfort height toilet;BSC;Grab bars;RW [employer] forces technical [employer] Details (indicate cue type and reason): looses balance when turning  Toileting- Clothing Manipulation and Hygiene: Minimal assistance;Sit to/from stand       Functional mobility during ADLs: Minimal assistance;+2 for physical assistance;+2 for safety/equipment;Rolling walker General ADL Comments: pt requires assist for balance     Vision Baseline Vision/History: Wears glasses Wears Glasses: At all times Patient Visual Report: No change from baseline Vision Assessment?: No apparent visual deficits     Perception     Praxis      Pertinent Vitals/Pain Pain Assessment: No/denies pain     Hand Dominance Right   Extremity/Trunk Assessment Upper Extremity Assessment Upper Extremity Assessment: Overall WFL for tasks assessed   Lower Extremity Assessment  Lower Extremity Assessment: Defer to PT evaluation   Cervical / Trunk Assessment Cervical / Trunk Assessment: Normal   Communication  Communication Communication: No difficulties   Cognition Arousal/Alertness: Awake/alert Behavior During Therapy: WFL for tasks assessed/performed;Impulsive Overall Cognitive Status: Impaired/Different from baseline Area of Impairment: Attention;Memory;Following commands;Safety/judgement;Awareness;Problem solving                   Current Attention Level: Selective Memory: Decreased short-term memory Following Commands: Follows multi-step commands inconsistently Safety/Judgement: Decreased awareness of deficits;Decreased awareness of safety Awareness: Emergent Problem Solving: Difficulty sequencing;Requires verbal cues General Comments: pt is very impulsive with decreased awareness of LOB when they occur while ambulating.  Requires mod cues for walker safety.  He required moderate cues to locate his room after walking down the hall outside his room - made multiple errors despite being cued    General Comments  Pt's wife and grand daughter present.  Pt and spouse with multiple questions re: his upcoming surgery and what exactly is the plan - encouraged him to discuss with Dr Ronnald Ramp     Exercises     Shoulder Instructions      Home Living Family/patient expects to be discharged to:: Private residence Living Arrangements: Spouse/significant other Available Help at Discharge: Family;Available 24 hours/day Type of Home: House Home Access: Stairs to enter [employer] of Steps: 4 to enter; den has 2 steps up Entrance Stairs-Rails: Left;Right;Can reach both Home Layout: Multi-level;Able to live on main level with bedroom/bathroom Alternate Level Stairs-Number of Steps: full flight    Bathroom Shower/Tub: Tub/shower unit;Door   [employer] Toilet: Standard     Home Equipment: Crutches          Prior Functioning/Environment Level of Independence: Needs assistance  Gait / Transfers Assistance Needed: started to use crutches ~2 weeks ago.  Prior to that was ambulating  with 1 crutch due to pelvic met  ADL's / Homemaking Assistance Needed: mod I             OT Problem List: Decreased strength;Decreased activity tolerance;Impaired balance (sitting and/or standing);Decreased cognition;Decreased safety awareness      OT Treatment/Interventions: Self-care/ADL training;Energy conservation;DME and/or AE instruction;Therapeutic activities;Cognitive remediation/compensation;Patient/family education;Balance training    OT Goals(Current goals can be found in the care plan section) Acute Rehab OT Goals Patient Stated Goal: to get better  OT Goal Formulation: With patient/family Time For Goal Achievement: 06/04/20 Potential to Achieve Goals: Good ADL Goals Pt Will Perform Grooming: with min guard assist;standing Pt Will Perform Lower Body Bathing: with min guard assist;sit to/from stand Pt Will Perform Lower Body Dressing: with min guard assist;sit to/from stand Pt Will Transfer to Toilet: with min guard assist;ambulating;regular height toilet;bedside commode;grab bars Pt Will Perform Toileting - Clothing Manipulation and hygiene: with min guard assist;sit to/from stand Additional ADL Goal #1: Pt will demonstrates good safety awareness during ADLs  OT Frequency: Min 2X/week   Barriers to D/C:            Co-evaluation PT/OT/SLP Co-Evaluation/Treatment: Yes Reason for Co-Treatment: For patient/therapist safety;To address functional/ADL transfers   OT goals addressed during session: ADL's and self-care      AM-PAC OT "6 Clicks" Daily Activity     Outcome Measure Help from another person eating meals?: None Help from another person taking care of personal grooming?: A Little Help from another person toileting, which includes using toliet, bedpan, or urinal?: A Lot Help from another person bathing (including washing, rinsing, drying)?: A Little Help from another person to put on  and taking off regular upper body clothing?: A Little Help from another  person to put on and taking off regular lower body clothing?: A Little 6 Click Score: 18   End of Session Equipment Utilized During Treatment: Gait belt;Rolling walker Nurse Communication: Mobility status  Activity Tolerance: Patient tolerated treatment well Patient left: in bed;with call bell/phone within reach;with family/visitor present  OT Visit Diagnosis: Unsteadiness on feet (R26.81)                Time: 5953-9672 OT Time Calculation (min): 44 min Charges:  OT General Charges $OT Visit: 1 Visit OT Evaluation $OT Eval Moderate Complexity: 1 Mod OT Treatments $Self Care/Home Management : 8-22 mins  Nilsa Nutting., OTR/L Acute Rehabilitation Services Pager [phone] Office [phone]   Lucille Passy M 05/21/2020, 6:11 PM
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
## 2020-05-21 NOTE — Evaluation (Signed)
Physical Therapy Evaluation Patient Details Name: Patrick [name] MRN: [id number] DOB: 27-Jan-1932 Today's Date: 05/21/2020   History of Present Illness  This 86 y.o. male admitted after sustaining a fall.  he was found to have a T6-T9 mass with compression of the spinal cord.  Surgery is recommended and planned for 11/28 as pt takes Eliquis so surgery delayed.  PMH includes: metastatic prostate CA with mets of the pelvis, A-Fib, Rt ulnar neuropathy, nonischemic cardiomyopathy, moderate aortic stenosis, LBBB, HTN, chronic systolic dysfunction  Clinical Impression  Pt admitted with/for fall found to be due to cord compression from tumors.  Pt needing from min to mod assist for mobility with a high fall risk with ambulation due to weakness and incoordination.  Pt currently limited functionally due to the problems listed. ( See problems list.)   Pt will benefit from PT to maximize function and safety in order to get ready for next venue listed below.     Follow Up Recommendations CIR;Supervision/Assistance - 24 hour    Equipment Recommendations  Other (comment);Rolling walker with 5" wheels (TBA further post surgery)    Recommendations for Other Services Rehab consult     Precautions / Restrictions Precautions Precautions: Fall Precaution Comments: pt very impulsive       Mobility  Bed Mobility Overal bed mobility: Needs Assistance Bed Mobility: Supine to Sit     Supine to sit: Supervision          Transfers Overall transfer level: Needs assistance Equipment used: Rolling walker (2 wheeled) Transfers: Sit to/from [facility] Sit to Stand: Min assist;+2 safety/equipment Stand pivot transfers: Min assist;+2 physical assistance;+2 safety/equipment       General transfer comment: Pt with LOB with turns.  Requires mod cues for hand placement and walker safety   Ambulation/Gait Ambulation/Gait assistance: Mod assist;+2 physical assistance;+2  safety/equipment Gait Distance (Feet): 120 Feet Assistive device: Rolling walker (2 wheeled) Gait Pattern/deviations: Step-through pattern;Ataxic;Wide base of support Gait velocity: slower to moderate Gait velocity interpretation: 1.31 - 2.62 ft/sec, indicative of limited community ambulator General Gait Details: pt with wide uncoordinated gait/occasionally ataxic, with decreased ability to control proximity to the RW and generally degrading with fatigue.  Stairs            Wheelchair Mobility    Modified Rankin (Stroke Patients Only)       Balance Overall balance assessment: Needs assistance Sitting-balance support: Feet supported Sitting balance-Leahy Scale: Good     Standing balance support: Bilateral upper extremity supported Standing balance-Leahy Scale: Poor Standing balance comment: requires UE support                              Pertinent Vitals/Pain Pain Assessment: No/denies pain    Home Living Family/patient expects to be discharged to:: Private residence Living Arrangements: Spouse/significant other Available Help at Discharge: Family;Available 24 hours/day Type of Home: House Home Access: Stairs to enter Entrance Stairs-Rails: Left;Right;Can reach both Entrance Stairs-Number of Steps: 4 to enter; den has 2 steps up Home Layout: Multi-level;Able to live on main level with bedroom/bathroom Home Equipment: Crutches      Prior Function Level of Independence: Needs assistance   Gait / Transfers Assistance Needed: started to use crutches ~2 weeks ago.  Prior to that was ambulating with 1 crutch due to pelvic met   ADL's / Homemaking Assistance Needed: mod I         Hand Dominance   Dominant Hand: Right  Extremity/Trunk Assessment   Upper Extremity Assessment Upper Extremity Assessment: Overall WFL for tasks assessed    Lower Extremity Assessment Lower Extremity Assessment: RLE deficits/detail;LLE deficits/detail RLE Deficits /  Details: general weakness proximally LLE Deficits / Details: general weakness and incoordination LLE Coordination: decreased fine motor    Cervical / Trunk Assessment Cervical / Trunk Assessment: Normal  Communication   Communication: No difficulties  Cognition Arousal/Alertness: Awake/alert Behavior During Therapy: WFL for tasks assessed/performed;Impulsive Overall Cognitive Status: Impaired/Different from baseline Area of Impairment: Attention;Memory;Following commands;Safety/judgement;Awareness;Problem solving                   Current Attention Level: Selective Memory: Decreased short-term memory Following Commands: Follows multi-step commands inconsistently Safety/Judgement: Decreased awareness of deficits;Decreased awareness of safety Awareness: Emergent Problem Solving: Difficulty sequencing;Requires verbal cues General Comments: pt is very impulsive with decreased awareness of LOB when they occur while ambulating.  Requires mod cues for walker safety.  He required moderate cues to locate his room after walking down the hall outside his room - made multiple errors despite being cued       General Comments General comments (skin integrity, edema, etc.): Pt's wife and grand daughter present.  Pt and spouse with multiple questions re: his upcoming surgery and what exactly is the plan - encouraged him to discuss with Dr Ronnald Ramp     Exercises     Assessment/Plan    PT Assessment Patient needs continued PT services  PT Problem List Decreased strength;Decreased balance;Decreased coordination;Decreased mobility;Decreased knowledge of use of DME;Decreased safety awareness       PT Treatment Interventions DME instruction;Gait training;Functional mobility training;Therapeutic activities;Stair training;Balance training;Neuromuscular re-education;Patient/family education    PT Goals (Current goals can be found in the Care Plan section)  Acute Rehab PT Goals Patient Stated  Goal: to get better  PT Goal Formulation: With patient/family Time For Goal Achievement: 06/04/20 Potential to Achieve Goals: Good    Frequency Min 4X/week   Barriers to discharge        Co-evaluation PT/OT/SLP Co-Evaluation/Treatment: Yes Reason for Co-Treatment: For patient/therapist safety PT goals addressed during session: Mobility/safety with mobility OT goals addressed during session: ADL's and self-care       AM-PAC PT "6 Clicks" Mobility  Outcome Measure Help needed turning from your back to your side while in a flat bed without using bedrails?: A Little Help needed moving from lying on your back to sitting on the side of a flat bed without using bedrails?: A Little Help needed moving to and from a bed to a chair (including a wheelchair)?: A Little Help needed standing up from a chair using your arms (e.g., wheelchair or bedside chair)?: A Little Help needed to walk in hospital room?: A Lot Help needed climbing 3-5 steps with a railing? : A Lot 6 Click Score: 16    End of Session   Activity Tolerance: Patient tolerated treatment well Patient left: in bed;with call bell/phone within reach;with family/visitor present Nurse Communication: Mobility status PT Visit Diagnosis: Unsteadiness on feet (R26.81);Other abnormalities of gait and mobility (R26.89);Other symptoms and signs involving the nervous system (R29.898);Difficulty in walking, not elsewhere classified (R26.2)    Time: 3817-7116 PT Time Calculation (min) (ACUTE ONLY): 44 min   Charges:   PT Evaluation $PT Eval Moderate Complexity: 1 Mod          05/21/2020  Ginger Carne., PT Acute Rehabilitation Services [phone]  (pager) [phone]  (office)  Tessie Fass Jehieli Brassell 05/21/2020, 6:36 PM

## 2020-05-21 NOTE — Progress Notes (Signed)
[facility] PROGRESS NOTE    Progress Note  Patrick [name]  TIW:[id number] DOB: 1931/12/08 DOA: 05/19/2020 PCP: Merryl Hacker, No     Brief Narrative:   Patrick [name] is an 86 y.o. male past medical history significant for metastatic prostate cancer diagnosed in 2016 previously treated with radiation therapy currently on chemo, essential hypertension, nonischemic cardiomyopathy with an EF of 40% presents after having a fall the day prior to admission in the ED he was found to have a T6 T9 mass with compression of the spinal cord neurosurgery was consulted recommended to hold Eliquis as day or think about surgical intervention on 05/23/2020 he also recommended started IV Decadron palliative care was also consulted and awaiting further recommendations.  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 MRI of the pelvis is pending. Urinary frequency and urgency probably due to to spinal injury. Urine cultures are pending.  Hypertensive urgency: Blood pressure has been fluctuating anywhere from 88/49-174/94, he is currently on Coreg and Entresto hydralazine IV as needed we will continue current regimen. Will need to have a low threshold for autonomic dysreflexia.  Dehydration: Due to elevated BUN he was started on IV fluid, now resolved.  Next  Fall at home: Consulted PT OT.  Normocytic anemia: No signs of overt bleeding hemoglobin at baseline.  Persistent atrial fibrillation: With a chads Vascor greater than 4, holding Eliquis bridging with heparin, will need to hold heparin at least 12 hours prior to procedure.  Prostate cancer metastatic to multiple sites [facility]) Noted.  DVT prophylaxis: heparin Family Communication:none Status is: Inpatient  Remains inpatient appropriate because:Hemodynamically  unstable   Dispo: The patient is from: Home              Anticipated d/c is to: SNF              Anticipated d/c date is: > 3 days              Patient currently is not medically stable to d/c.        Code Status:     Code Status Orders  (From admission, onward)         Start     Ordered   05/20/20 1550  Full code  Continuous        05/20/20 1551        Code Status History    This patient has a current code status but no historical code status.   Advance Care Planning Activity    Advance Directive Documentation     Most Recent Value  Type of Advance Directive Living will, Healthcare Power of Attorney  Pre-existing out of facility DNR order (yellow form or pink MOST form) --  "MOST" Form in Place? --        IV Access:    Peripheral IV   Procedures and diagnostic studies:   DG Lumbar Spine Complete  Result Date: 05/19/2020 CLINICAL DATA:  Right hip and low back pain after a fall EXAM: LUMBAR SPINE - COMPLETE 4+ VIEW COMPARISON:  09/18/2019 FINDINGS: Mild lumbar scoliosis convex towards the right, unchanged. No anterior subluxation. Diffuse bone demineralization. Compression of the superior endplates at S50, L1, and L2 is unchanged since prior study, likely indicating osteoporosis. No new vertebral compression. Degenerative changes with narrowed interspaces and endplate hypertrophic change. Degenerative  changes in the lumbar facet joints. No focal bone lesion or bone destruction. Aortic vascular calcifications. IMPRESSION: Mild scoliosis and degenerative changes. Diffuse bone demineralization. No acute compression. Electronically Signed   By: Lucienne Capers M.D.   On: 05/19/2020 23:52   CT HEAD WO CONTRAST  Result Date: 05/19/2020 CLINICAL DATA:  Status post fall. EXAM: CT HEAD WITHOUT CONTRAST TECHNIQUE: Contiguous axial images were obtained from the base of the skull through the vertex without intravenous contrast. COMPARISON:  None. FINDINGS: Brain: There  is mild cerebral atrophy with widening of the extra-axial spaces and ventricular dilatation. There are areas of decreased attenuation within the white matter tracts of the supratentorial brain, consistent with microvascular disease changes. Vascular: No hyperdense vessel or unexpected calcification. Skull: Normal. Negative for fracture or focal lesion. Sinuses/Orbits: There is marked severity frontal, sphenoid, bilateral ethmoid and left maxillary sinus mucosal thickening. Mild to moderate severity right maxillary sinus mucosal thickening is also seen. Other: None. IMPRESSION: 1. Mild cerebral atrophy and microvascular disease changes of the supratentorial brain. 2. No acute intracranial abnormality. 3. Marked severity pansinus disease. Electronically Signed   By: Virgina Norfolk M.D.   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 No intravenous contrast was administered. COMPARISON:  10/05/2011 cervical spine radiographs. FINDINGS: Alignment: Grade 1 C3-4 and C4-5 anterolisthesis. Grade 1 C7-T1 anterolisthesis. Vertebrae: Vertebral body heights are preserved. Diffuse bone marrow heterogeneity. Ill-defined STIR hyperintense signal involving the anterior C4 vertebral body. Cord: Normal signal and morphology. Posterior Fossa, vertebral arteries: Negative. Disc levels: Multilevel desiccation and disc space loss. Partial fusion at the C5-6 level. C2-3: No significant disc bulge. Uncovertebral and facet degenerative spurring. Patent spinal canal and neural foramen. C3-4: Disc osteophyte complex with superimposed central protrusion. Uncovertebral and facet hypertrophy. Patent spinal canal. Mild bilateral neural foraminal narrowing. C4-5: Disc osteophyte complex with right predominant uncovertebral  and facet hypertrophy. Shallow central protrusion. Patent spinal canal and left neural foramen. Moderate to severe right neural foraminal narrowing. C5-6: Disc osteophyte complex partially effacing the ventral CSF containing spaces with uncovertebral and facet hypertrophy. Prominent ligamentum flavum. Mild spinal canal and moderate bilateral neural foraminal narrowing. C6-7: Disc osteophyte complex with uncovertebral and facet hypertrophy. Prominent ligamentum flavum. Mild spinal canal, mild left and moderate right neural foraminal narrowing. C7-T1: Minimal grade 1 anterolisthesis with uncovered posterior bulge. Patent spinal canal and neural foramen. Paraspinal tissues: Negative. IMPRESSION: 1. Anterior C4 vertebral body STIR hyperintense signal is concerning for osseous metastases. No fracture. Consider postcontrast imaging for further evaluation. 2. Multilevel spondylosis as detailed above. 3. Mild C5-6 and C6-7 spinal canal narrowing. 4. Moderate to severe right C4-5, bilateral C5-6 and right C6-7 neural foraminal narrowing. Electronically Signed   By: Primitivo Gauze M.D.   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 No intravenous contrast was administered. COMPARISON:  Concurrent MRI thoracic spine. 05/19/2020 lumbar spine radiographs. FINDINGS: Segmentation:  Standard. Alignment: Minimal grade 1 L3-4 retrolisthesis. Straightening of lordosis. Vertebrae: Chronic L1 posttraumatic deformity. No retropulsion. Mild multilevel Modic type 2 endplate degenerative changes. Partially imaged left sacral ala osseous metastases measuring  5.0 x 4.6 cm (2:14). No discrete acute fracture. STIR hyperintense signal underlying the posterior inferior L1 endplate.  Conus medullaris and cauda equina: Conus extends to the L1 level. Conus and cauda equina appear normal. Disc levels: Multilevel desiccation and disc space loss. Multilevel endplate degenerative spurring. L1-2: No significant disc bulge. Bilateral facet degenerative spurring. Patent spinal canal and neural foramen. L2-3: Mild disc bulge with superimposed right foraminal protrusion. Facet degenerative spurring. Patent spinal canal and neural foramen. L3-4: Mild disc bulge and bilateral facet degenerative spurring. Patent spinal canal. Mild bilateral neural foraminal narrowing. L4-5: Disc bulge with shallow right foraminal and central protrusions. Facet degenerative spurring. Patent spinal canal. Mild bilateral neural foraminal narrowing. L5-S1: Minimal disc bulge and bilateral facet hypertrophy. Patent spinal canal and right neural foramen. Mild left neural foraminal narrowing. Paraspinal and other soft tissues: Bilateral renal cysts. IMPRESSION: 1. Partially imaged left sacral ala osseous metastases measuring 5.0 x 4.6 cm. Consider MRI pelvis with and without contrast for better evaluation. 2. STIR hyperintense signal underlying the dorsal inferior endplate of L1, degenerative versus small osseous metastasis. No discrete acute fracture. 3. Multilevel spondylosis as detailed above. Patent spinal canal. Multilevel mild neural foraminal narrowing. Electronically Signed   By: Primitivo Gauze M.D.   On: 05/20/2020 09:58   MR THORACIC SPINE W WO CONTRAST  Result Date: 05/20/2020 CLINICAL DATA:  Mid-back pain leg weakness, h/o prostate cancer to bone EXAM: MRI THORACIC WITHOUT AND WITH CONTRAST TECHNIQUE: Multiplanar and multiecho pulse sequences of the thoracic spine were obtained without and with intravenous contrast. CONTRAST:  7.10mL GADAVIST GADOBUTROL 1 MMOL/ML IV SOLN COMPARISON:  Concurrent MRI of the cervical and lumbar spine. 10/05/2011 thoracic spine radiographs. FINDINGS: MRI THORACIC SPINE FINDINGS  Alignment:  Normal. Vertebrae: Chronic L1 posttraumatic deformity with mild height loss. Thoracic vertebral body heights are preserved. Abnormal STIR hyperintense bone marrow signal at the T6-T8 levels involving the posterior elements. No discrete fracture. Cord: Normal cord morphology. Mild T2 hyperintense signal at the T8 level (19:9) without focal enhancement. Paraspinal and other soft tissues: Discussed below. Disc levels: Enhancing posterior epidural soft tissue measuring 8.2 by 1.3 cm spanning the T6-T8 levels (19:8). The soft tissue demonstrates extension into the lateral epidural spaces with moderate to severe spinal canal narrowing. There is extension of abnormal enhancing soft tissue into the para and prevertebral spaces at the T6-T9 levels. Abnormal enhancing soft tissue extends laterally to encase the left greater than right ribs at these levels. IMPRESSION: Epidural soft tissue metastases at the T6-T9 levels extending into the pre/paravertebral space with encasement of the left greater than right proximal ribs. Moderate to severe spinal canal narrowing at the T6-9 levels secondary to soft tissue encasement. Mild T2 hyperintense cord signal at the T8 level may reflect edema. Osseous metastatic involvement of the T6-T8 vertebral bodies without discrete pathologic fracture. Electronically Signed   By: Primitivo Gauze M.D.   On: 05/20/2020 09:50   DG Hip Unilat W or Wo Pelvis 2-3 Views Right  Result Date: 05/19/2020 CLINICAL DATA:  Right hip and low back pain after a fall this evening. EXAM: DG HIP (WITH OR WITHOUT PELVIS) 2-3V RIGHT COMPARISON:  None. FINDINGS: No evidence of acute fracture or dislocation involving the pelvis or right hip. SI joints and symphysis pubis are nondisplaced. Narrowing and sclerosis in the SI joints may represent partial ankylosis or degenerative change. No focal bone lesion or bone destruction. Soft tissues are unremarkable. IMPRESSION: No acute bony abnormalities.  Electronically Signed   By: Gwyndolyn Saxon  Gerilyn Nestle M.D.   On: 05/19/2020 23:50     Medical Consultants:    None.  Anti-Infectives:   none  Subjective:    Patrick [name] relates his pain is controlled.  Objective:    Vitals:   05/20/20 1839 05/20/20 1909 05/20/20 2048 05/21/20 0419  BP: (!) 88/49 (!) 103/59  (!) 175/97  Pulse: 67 64    Resp: 15 14    Temp:   97.9 F (36.6 C) (!) 97.5 F (36.4 C)  TempSrc:   Oral Oral  SpO2:   99% 99%  Weight:      Height:       SpO2: 99 %   Intake/Output Summary (Last 24 hours) at 05/21/2020 0240 Last data filed at 05/21/2020 0600 Gross per 24 hour  Intake 641.7 ml  Output 1801 ml  Net -1159.3 ml   Filed Weights   05/19/20 2312  Weight: 82.6 kg    Exam: General exam: In no acute distress. Respiratory system: Good air movement and clear to auscultation. Cardiovascular system: S1 & S2 heard, RRR. No JVD. Gastrointestinal system: Abdomen is nondistended, soft and nontender.  Extremities: No pedal edema. Skin: No rashes, lesions or ulcers Psychiatry: Judgement and insight appear normal. Mood & affect appropriate.    Data Reviewed:    Labs: Basic Metabolic Panel: Recent Labs  Lab 05/20/20 0343 05/21/20 0201  NA 134* 138  K 3.8 3.6  CL 101 101  CO2 25 24  GLUCOSE 113* 168*  BUN 30* 18  CREATININE 0.90 0.78  CALCIUM 9.6 9.4  MG  --  2.0   GFR Estimated Creatinine Clearance: 68.4 mL/min (by C-G formula based on SCr of 0.78 mg/dL). Liver Function Tests: Recent Labs  Lab 05/21/20 0201  AST 26  ALT 17  ALKPHOS 68  BILITOT 0.8  PROT 7.1  ALBUMIN 3.7   No results for input(s): LIPASE, AMYLASE in the last 168 hours. No results for input(s): AMMONIA in the last 168 hours. Coagulation profile Recent Labs  Lab 05/20/20 0343  INR 1.2   COVID-19 Labs  No results for input(s): DDIMER, FERRITIN, LDH, CRP in the last 72 hours.  Lab Results  Component Value Date   [place] NEGATIVE 05/20/2020     CBC: Recent Labs  Lab 05/20/20 0343 05/21/20 0201  WBC 5.6 3.5*  NEUTROABS 3.4  --   HGB 11.3* 12.7*  HCT 34.5* 38.6*  MCV 93.0 92.1  PLT 239 269   Cardiac Enzymes: No results for input(s): CKTOTAL, CKMB, CKMBINDEX, TROPONINI in the last 168 hours. BNP (last 3 results) No results for input(s): PROBNP in the last 8760 hours. CBG: No results for input(s): GLUCAP in the last 168 hours. D-Dimer: No results for input(s): DDIMER in the last 72 hours. Hgb A1c: No results for input(s): HGBA1C in the last 72 hours. Lipid Profile: No results for input(s): CHOL, HDL, LDLCALC, TRIG, CHOLHDL, LDLDIRECT in the last 72 hours. Thyroid function studies: No results for input(s): TSH, T4TOTAL, T3FREE, THYROIDAB in the last 72 hours.  Invalid input(s): FREET3 Anemia work up: No results for input(s): VITAMINB12, FOLATE, FERRITIN, TIBC, IRON, RETICCTPCT in the last 72 hours. Sepsis Labs: Recent Labs  Lab 05/20/20 0343 05/21/20 0201  WBC 5.6 3.5*   Microbiology Recent Results (from the past 240 hour(s))  Resp Panel by RT-PCR (Flu A&B, Covid) Nasopharyngeal Swab     Status: None   Collection Time: 05/20/20 10:04 AM   Specimen: Nasopharyngeal Swab; Nasopharyngeal(NP) swabs in vial transport medium  Result  Value Ref Range Status   SARS Coronavirus 2 by RT PCR NEGATIVE NEGATIVE Final    Comment: (NOTE) SARS-CoV-2 target nucleic acids are NOT DETECTED.  The SARS-CoV-2 RNA is generally detectable in upper respiratory specimens during the acute phase of infection. The lowest concentration of SARS-CoV-2 viral copies this assay can detect is 138 copies/mL. A negative result does not preclude SARS-Cov-2 infection and should not be used as the sole basis for treatment or other patient management decisions. A negative result may occur with  improper specimen collection/handling, submission of specimen other than nasopharyngeal swab, presence of viral mutation(s) within the areas targeted by  this assay, and inadequate number of viral copies(<138 copies/mL). A negative result must be combined with clinical observations, patient history, and epidemiological information. The expected result is Negative.  Fact Sheet for Patients:  EntrepreneurPulse.com.au  Fact Sheet for Healthcare Providers:  IncredibleEmployment.be  This test is no t yet approved or cleared by the [place] FDA and  has been authorized for detection and/or diagnosis of SARS-CoV-2 by FDA under an Emergency Use Authorization (EUA). This EUA will remain  in effect (meaning this test can be used) for the duration of the COVID-19 declaration under Section 564(b)(1) of the Act, 21 U.S.C.section 360bbb-3(b)(1), unless the authorization is terminated  or revoked sooner.       Influenza A by PCR NEGATIVE NEGATIVE Final   Influenza B by PCR NEGATIVE NEGATIVE Final    Comment: (NOTE) The Xpert Xpress SARS-CoV-2/FLU/RSV plus assay is intended as an aid in the diagnosis of influenza from Nasopharyngeal swab specimens and should not be used as a sole basis for treatment. Nasal washings and aspirates are unacceptable for Xpert Xpress SARS-CoV-2/FLU/RSV testing.  Fact Sheet for Patients: EntrepreneurPulse.com.au  Fact Sheet for Healthcare Providers: IncredibleEmployment.be  This test is not yet approved or cleared by the [place] FDA and has been authorized for detection and/or diagnosis of SARS-CoV-2 by FDA under an Emergency Use Authorization (EUA). This EUA will remain in effect (meaning this test can be used) for the duration of the COVID-19 declaration under Section 564(b)(1) of the Act, 21 U.S.C. section 360bbb-3(b)(1), unless the authorization is terminated or revoked.  Performed at [facility], [place] [address], [place], [place] [address]   MRSA PCR Screening     Status: None   Collection Time: 05/20/20  3:47 PM    Specimen: Nasal Mucosa; Nasopharyngeal  Result Value Ref Range Status   MRSA by PCR NEGATIVE NEGATIVE Final    Comment:        The GeneXpert MRSA Assay (FDA approved for NASAL specimens only), is one component of a comprehensive MRSA colonization surveillance program. It is not intended to diagnose MRSA infection nor to guide or monitor treatment for MRSA infections. Performed at [facility], [place] [address] [place], [place] [address]      Medications:   . carvedilol  6.25 mg Oral BID WC  . dexamethasone (DECADRON) injection  4 mg Intravenous Q8H  . sacubitril-valsartan  1 tablet Oral QHS  . sodium chloride flush  3 mL Intravenous Q12H   Continuous Infusions: . sodium chloride 50 mL/hr at 05/20/20 1930  . heparin 1,150 Units/hr (05/20/20 2213)      LOS: 1 day   Charlynne Cousins  [facility]  05/21/2020, 7:02 AM

## 2020-05-21 NOTE — Progress Notes (Signed)
ANTICOAGULATION CONSULT NOTE - Follow Up Consult  Pharmacy Consult for heparin Indication: atrial fibrillation   Labs: Recent Labs    05/20/20 0343 05/20/20 1708 05/21/20 0200 05/21/20 0201  HGB 11.3*  --   --  12.7*  HCT 34.5*  --   --  38.6*  PLT 239  --   --  269  APTT  --  39* 67*  --   LABPROT 14.4  --   --   --   INR 1.2  --   --   --   HEPARINUNFRC  --   --   --  1.54*  CREATININE 0.90  --   --  0.78    Assessment/Plan:  86yo male therapeutic on heparin with initial dosing while Eliquis on hold. Will continue gtt at current rate and confirm stable with additional PTT.   Wynona Neat, PharmD, BCPS  05/21/2020,4:04 AM

## 2020-05-22 DIAGNOSIS — I4819 Other persistent atrial fibrillation: Secondary | ICD-10-CM | POA: Diagnosis not present

## 2020-05-22 DIAGNOSIS — Z7901 Long term (current) use of anticoagulants: Secondary | ICD-10-CM | POA: Diagnosis not present

## 2020-05-22 DIAGNOSIS — R29898 Other symptoms and signs involving the musculoskeletal system: Secondary | ICD-10-CM | POA: Diagnosis not present

## 2020-05-22 DIAGNOSIS — D649 Anemia, unspecified: Secondary | ICD-10-CM | POA: Diagnosis not present

## 2020-05-22 DIAGNOSIS — G9529 Other cord compression: Secondary | ICD-10-CM | POA: Diagnosis not present

## 2020-05-22 DIAGNOSIS — I160 Hypertensive urgency: Secondary | ICD-10-CM | POA: Diagnosis not present

## 2020-05-22 LAB — CBC
HCT: 33.7 % — ABNORMAL LOW (ref 39.0–52.0)
Hemoglobin: 11.4 g/dL — ABNORMAL LOW (ref 13.0–17.0)
MCH: 30.6 pg (ref 26.0–34.0)
MCHC: 33.8 g/dL (ref 30.0–36.0)
MCV: 90.6 fL (ref 80.0–100.0)
Platelets: 251 10*3/uL (ref 150–400)
RBC: 3.72 MIL/uL — ABNORMAL LOW (ref 4.22–5.81)
RDW: 14.7 % (ref 11.5–15.5)
WBC: 7.5 10*3/uL (ref 4.0–10.5)
nRBC: 0 % (ref 0.0–0.2)

## 2020-05-22 LAB — APTT: aPTT: 90 seconds — ABNORMAL HIGH (ref 24–36)

## 2020-05-22 LAB — HEPARIN LEVEL (UNFRACTIONATED): Heparin Unfractionated: 1.04 IU/mL — ABNORMAL HIGH (ref 0.30–0.70)

## 2020-05-22 MED ORDER — HYDRALAZINE HCL 25 MG PO TABS: 25.0000 mg | ORAL_TABLET | Freq: Four times a day (QID) | ORAL | Status: DC | PRN

## 2020-05-22 MED ORDER — HYDROCODONE-ACETAMINOPHEN 5-325 MG PO TABS: 1.0000 | ORAL_TABLET | ORAL | Status: DC | PRN

## 2020-05-22 MED ORDER — BISACODYL 5 MG PO TBEC: 5.0000 mg | DELAYED_RELEASE_TABLET | Freq: Every day | ORAL | Status: DC | PRN
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
## 2020-05-22 NOTE — Progress Notes (Addendum)
[facility] PROGRESS NOTE    Progress Note  Patrick [name]  GHW:[id number] DOB: 1932-05-09 DOA: 05/19/2020 PCP: Merryl Hacker, No     Brief Narrative:   Patrick [name] is an 86 y.o. male past medical history significant for metastatic prostate cancer diagnosed in 2016 previously treated with radiation therapy currently on chemo, essential hypertension, nonischemic cardiomyopathy with an EF of 40% presents after having a fall the day prior to admission in the ED he was found to have a T6 T9 mass with compression of the spinal cord neurosurgery was consulted recommended to hold Eliquis as day or think about surgical intervention on 05/23/2020 he also recommended started IV Decadron palliative care was also consulted and awaiting further recommendations.  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 MRI of the pelvis diffuse metastatic disease in the pelvis, there is a large ulcerated metastatic encompassing the right iliac bone from the iliac crest down to the acetabulum including the anterior and posterior wall of the quadratus lobe plate there is a second dominant lesion in the left sacrum Urinary frequency and urgency probably due to to spinal injury. Urine cultures are negative till date  Hypertensive urgency: Blood pressure is well controlled continue Coreg we will hold Entresto tomorrow morning.  Use hydralazine IV as needed.  Dehydration: Due to elevated BUN he was started on IV fluid, now resolved.  Next  Fall at home: Consulted PT OT.  Normocytic anemia: No signs of overt bleeding hemoglobin at baseline.  Persistent atrial fibrillation: With a chads Vascor greater than 4, holding Eliquis bridging with heparin, will need to hold heparin at least 8 hours prior to procedure.  Prostate cancer  metastatic to multiple sites [facility]) Noted.  ? Neurogenic bladder: Will re-evaluate after surgery  DVT prophylaxis: heparin Family Communication:none Status is: Inpatient  Remains inpatient appropriate because:Hemodynamically unstable   Dispo: The patient is from: Home              Anticipated d/c is to: SNF              Anticipated d/c date is: > 3 days              Patient currently is not medically stable to d/c.     Code Status:     Code Status Orders  (From admission, onward)         Start     Ordered   05/20/20 1550  Full code  Continuous        05/20/20 1551        Code Status History    This patient has a current code status but no historical code status.   Advance Care Planning Activity    Advance Directive Documentation     Most Recent Value  Type of Advance Directive Living will, Healthcare Power of Attorney  Pre-existing out of facility DNR order (yellow form or pink MOST form) --  "MOST" Form in Place? --        IV Access:    Peripheral IV   Procedures and diagnostic studies:   MR Cervical Spine Wo Contrast  Result Date: 05/20/2020 CLINICAL DATA:  leg weakness, h/o prostate cancer to bone EXAM: MRI CERVICAL SPINE WITHOUT CONTRAST TECHNIQUE: Multiplanar, multisequence MR imaging of the cervical spine was performed. No intravenous contrast was administered.  COMPARISON:  10/05/2011 cervical spine radiographs. FINDINGS: Alignment: Grade 1 C3-4 and C4-5 anterolisthesis. Grade 1 C7-T1 anterolisthesis. Vertebrae: Vertebral body heights are preserved. Diffuse bone marrow heterogeneity. Ill-defined STIR hyperintense signal involving the anterior C4 vertebral body. Cord: Normal signal and morphology. Posterior Fossa, vertebral arteries: Negative. Disc levels: Multilevel desiccation and disc space loss. Partial fusion at the C5-6 level. C2-3: No significant disc bulge. Uncovertebral and facet degenerative spurring. Patent spinal canal and neural foramen.  C3-4: Disc osteophyte complex with superimposed central protrusion. Uncovertebral and facet hypertrophy. Patent spinal canal. Mild bilateral neural foraminal narrowing. C4-5: Disc osteophyte complex with right predominant uncovertebral and facet hypertrophy. Shallow central protrusion. Patent spinal canal and left neural foramen. Moderate to severe right neural foraminal narrowing. C5-6: Disc osteophyte complex partially effacing the ventral CSF containing spaces with uncovertebral and facet hypertrophy. Prominent ligamentum flavum. Mild spinal canal and moderate bilateral neural foraminal narrowing. C6-7: Disc osteophyte complex with uncovertebral and facet hypertrophy. Prominent ligamentum flavum. Mild spinal canal, mild left and moderate right neural foraminal narrowing. C7-T1: Minimal grade 1 anterolisthesis with uncovered posterior bulge. Patent spinal canal and neural foramen. Paraspinal tissues: Negative. IMPRESSION: 1. Anterior C4 vertebral body STIR hyperintense signal is concerning for osseous metastases. No fracture. Consider postcontrast imaging for further evaluation. 2. Multilevel spondylosis as detailed above. 3. Mild C5-6 and C6-7 spinal canal narrowing. 4. Moderate to severe right C4-5, bilateral C5-6 and right C6-7 neural foraminal narrowing. Electronically Signed   By: Primitivo Gauze M.D.   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 No intravenous contrast was administered. COMPARISON:  Concurrent MRI thoracic spine. 05/19/2020 lumbar spine radiographs. FINDINGS: Segmentation:  Standard. Alignment: Minimal grade 1 L3-4 retrolisthesis. Straightening of lordosis. Vertebrae: Chronic L1 posttraumatic deformity. No retropulsion. Mild  multilevel Modic type 2 endplate degenerative changes. Partially imaged left sacral ala osseous metastases measuring 5.0 x 4.6 cm (2:14). No discrete acute fracture. STIR hyperintense signal underlying the posterior inferior L1 endplate. Conus medullaris and cauda equina: Conus extends to the L1 level. Conus and cauda equina appear normal. Disc levels: Multilevel desiccation and disc space loss. Multilevel endplate degenerative spurring. L1-2: No significant disc bulge. Bilateral facet degenerative spurring. Patent spinal canal and neural foramen. L2-3: Mild disc bulge with superimposed right foraminal protrusion. Facet degenerative spurring. Patent spinal canal and neural foramen. L3-4: Mild disc bulge and bilateral facet degenerative spurring. Patent spinal canal. Mild bilateral neural foraminal narrowing. L4-5: Disc bulge with shallow right foraminal and central protrusions. Facet degenerative spurring. Patent spinal canal. Mild bilateral neural foraminal narrowing. L5-S1: Minimal disc bulge and bilateral facet hypertrophy. Patent spinal canal and right neural foramen. Mild left neural foraminal narrowing. Paraspinal and other soft tissues: Bilateral renal cysts. IMPRESSION: 1. Partially imaged left sacral ala osseous metastases measuring 5.0 x 4.6 cm. Consider MRI pelvis with and without contrast for better evaluation. 2. STIR hyperintense signal underlying the dorsal inferior endplate of L1, degenerative versus small osseous metastasis. No discrete acute fracture. 3. Multilevel spondylosis as detailed above. Patent spinal canal. Multilevel mild neural foraminal narrowing. Electronically Signed   By: Primitivo Gauze M.D.   On: 05/20/2020 09:58   MR THORACIC SPINE W WO CONTRAST  Result Date: 05/20/2020 CLINICAL DATA:  Mid-back pain leg weakness, h/o prostate cancer to bone EXAM: MRI  THORACIC WITHOUT AND WITH CONTRAST TECHNIQUE: Multiplanar and multiecho pulse sequences of the thoracic spine were obtained  without and with intravenous contrast. CONTRAST:  7.16mL GADAVIST GADOBUTROL 1 MMOL/ML IV SOLN COMPARISON:  Concurrent MRI of the cervical and lumbar spine. 10/05/2011 thoracic spine radiographs. FINDINGS: MRI THORACIC SPINE FINDINGS Alignment:  Normal. Vertebrae: Chronic L1 posttraumatic deformity with mild height loss. Thoracic vertebral body heights are preserved. Abnormal STIR hyperintense bone marrow signal at the T6-T8 levels involving the posterior elements. No discrete fracture. Cord: Normal cord morphology. Mild T2 hyperintense signal at the T8 level (19:9) without focal enhancement. Paraspinal and other soft tissues: Discussed below. Disc levels: Enhancing posterior epidural soft tissue measuring 8.2 by 1.3 cm spanning the T6-T8 levels (19:8). The soft tissue demonstrates extension into the lateral epidural spaces with moderate to severe spinal canal narrowing. There is extension of abnormal enhancing soft tissue into the para and prevertebral spaces at the T6-T9 levels. Abnormal enhancing soft tissue extends laterally to encase the left greater than right ribs at these levels. IMPRESSION: Epidural soft tissue metastases at the T6-T9 levels extending into the pre/paravertebral space with encasement of the left greater than right proximal ribs. Moderate to severe spinal canal narrowing at the T6-9 levels secondary to soft tissue encasement. Mild T2 hyperintense cord signal at the T8 level may reflect edema. Osseous metastatic involvement of the T6-T8 vertebral bodies without discrete pathologic fracture. Electronically Signed   By: Primitivo Gauze M.D.   On: 05/20/2020 09:50   MR PELVIS W WO CONTRAST  Addendum Date: 05/21/2020   ADDENDUM REPORT: 05/21/2020 16:51 ADDENDUM: The original report was by Dr. Zetta Bills. The following addendum is by Dr. Van Clines: Original extensive imaging of the prostate consisting of many thousands of images reported previously by Dr. Jacalyn Lefevre. The patient was  brought back for additional imaging using the bony pelvis protocol, and I was asked to addend Dr. Reymundo Poll dictation with my interpretation of these additional images. Osseous structures: Large osseous metastatic lesion encompassing much of the right iliac bone extending from about the level of the iliac crest down to involve the acetabulum, including the anterior and posterior walls as well as the upper quadrilateral plate. This has high T2 and intermediate to low precontrast T1 signal characteristics. Lower lumbar spondylosis and degenerative disc disease. A second dominant lesion is in the left sacrum, measuring 5.4 by 3.9 by 5.1 cm, primarily involving the S1 and S2 segments. This extends over to abut the SI joint. No other significant osseous metastatic lesions are identified. Musculotendinous: Low-level edema deep to the right iliacus muscle is present on image 11 of series 216, adjacent to the iliac bone metastatic lesion. This tracks down along the iloipsoas. There is also low-level edema tracking in the right hip adductor musculature. Hamstring tendons intact. Other: Sigmoid colon diverticulosis. Urinary bladder unremarkable. Low-grade presacral edema. Electronically Signed   By: Van Clines M.D.   On: 05/21/2020 16:51   Addendum Date: 05/21/2020   ADDENDUM REPORT: 05/21/2020 09:38 ADDENDUM: Study was performed as a prostate evaluation and should have been performed as a musculoskeletal pelvis. Findings were discussed with the referring physician as outlined below. Findings were also relayed to staff at the site in preparation for repeat imaging. These results were called by telephone at the time of interpretation on 05/21/2020 at 8:25 is am to provider Dr. Shelba Flake, who verbally acknowledged these results. Electronically Signed   By: Zetta Bills M.D.   On: 05/21/2020 09:38   Result Date: 05/21/2020  CLINICAL DATA:  History of prostate cancer. EXAM: MRI PELVIS WITHOUT AND WITH CONTRAST TECHNIQUE:  Multiplanar multisequence MR imaging of the pelvis was performed both before and after administration of intravenous contrast. CONTRAST:  8.4mL GADAVIST GADOBUTROL 1 MMOL/ML IV SOLN COMPARISON:  Chest abdomen and pelvis CT from March of 2021 FINDINGS: Urinary Tract: Urinary bladder is distended and trabeculated suggesting bladder outlet obstruction or neurogenic bladder. No distal ureteral dilation. Cysts of the LEFT kidney partially visualized. RIGHT renal cysts also partially visualized. Excreted contrast perhaps from the lumbar spine evaluation in the urinary bladder. Bowel: Visualized gastrointestinal tract without acute process. Bowel is incompletely imaged on the current exam is and study was not performed for bowel evaluation. Colonic diverticulosis of the sigmoid. Vascular/Lymphatic: Vascular structures are grossly patent.w incompletely evaluated. Limited field of view for postcontrast images per protocol, prostate protocol was performed on the current study. Reproductive:  Prostate: Transitional zone: Nodular appearance of the transitional zone with suggestion of prior TURP. Diffuse low signal throughout the transitional zone. Concentrated gadolinium in the urinary bladder limits assessment on diffusion-weighted imaging. Peripheral zone: Diffuse low signal throughout the peripheral zone with indistinct boundary of the prostate margin at the base of the LEFT prostate extending towards the atrophic LEFT seminal vesicles. RIGHT seminal vesicles also with atrophy. Other:  No ascites. Musculoskeletal: Large metastatic lesion in the LEFT sacrum (image 1, series 9) incompletely imaged, 5.1 x 3.8 cm with respect imaged portions. Infiltration of the RIGHT acetabulum also with metastatic disease and pubic root on the RIGHT as well, metastatic lesion extending into the RIGHT iliac bone involving much of the iliac bone in the entire roof of the RIGHT acetabulum. Sequences were not performed to assess for acute process  such is pathologic fracture. Study was performed as a prostate protocol. No gross lesion seen in the lower spinal canal but this was evaluated on the recent MRI of the spine, please refer to that imaging study for further detail. Is IMPRESSION: 1. Diffuse low signal throughout the prostate. Suspicion for diffuse prostate cancer based on appearance of the prostate. PIRADS not assigned due to the presence of diffuse bony metastatic disease in the pelvis. Suspected extracapsular extension in the LEFT prostate base and potentially mid prostate. 2. RIGHT iliac, acetabular and pubic root involvement with involvement of the LEFT sacrum. Dedicated pelvic MRI for assessment of the bony pelvis may be helpful if there is clinical concern for pathologic fracture. No gross displacement of the bony pelvis is noted though study was not performed for fracture evaluation. 3. Urinary bladder is distended and trabeculated suggesting bladder outlet obstruction or neurogenic bladder. No distal ureteral dilation. Is Electronically Signed: By: Zetta Bills M.D. On: 05/21/2020 08:19     Medical Consultants:    None.  Anti-Infectives:   none  Subjective:    Patrick [name] is sleepy.  Objective:    Vitals:   05/21/20 1406 05/21/20 1931 05/21/20 2300 05/22/20 0326  BP: (!) 82/65 108/68 108/68 115/72  Pulse: 77 75 72 73  Resp: (!) 23  17 16   Temp:  98 F (36.7 C) 97.9 F (36.6 C) 97.6 F (36.4 C)  TempSrc:  Oral Oral Oral  SpO2:  96% 97% 98%  Weight:      Height:       SpO2: 98 %   Intake/Output Summary (Last 24 hours) at 05/22/2020 0731 Last data filed at 05/22/2020 0500 Gross per 24 hour  Intake 167.01 ml  Output 1050 ml  Net -882.99  ml   Filed Weights   05/19/20 2312  Weight: 82.6 kg    Exam: General exam: In no acute distress. Respiratory system: Good air movement and clear to auscultation. Cardiovascular system: S1 & S2 heard, RRR. No JVD. Gastrointestinal system: Abdomen is  nondistended, soft and nontender.  Extremities: No pedal edema. Skin: No rashes, lesions or ulcers  Data Reviewed:    Labs: Basic Metabolic Panel: Recent Labs  Lab 05/20/20 0343 05/21/20 0201  NA 134* 138  K 3.8 3.6  CL 101 101  CO2 25 24  GLUCOSE 113* 168*  BUN 30* 18  CREATININE 0.90 0.78  CALCIUM 9.6 9.4  MG  --  2.0   GFR Estimated Creatinine Clearance: 68.4 mL/min (by C-G formula based on SCr of 0.78 mg/dL). Liver Function Tests: Recent Labs  Lab 05/21/20 0201  AST 26  ALT 17  ALKPHOS 68  BILITOT 0.8  PROT 7.1  ALBUMIN 3.7   No results for input(s): LIPASE, AMYLASE in the last 168 hours. No results for input(s): AMMONIA in the last 168 hours. Coagulation profile Recent Labs  Lab 05/20/20 0343  INR 1.2   COVID-19 Labs  No results for input(s): DDIMER, FERRITIN, LDH, CRP in the last 72 hours.  Lab Results  Component Value Date   [place] NEGATIVE 05/20/2020    CBC: Recent Labs  Lab 05/20/20 0343 05/21/20 0201 05/22/20 0217  WBC 5.6 3.5* 7.5  NEUTROABS 3.4  --   --   HGB 11.3* 12.7* 11.4*  HCT 34.5* 38.6* 33.7*  MCV 93.0 92.1 90.6  PLT 239 269 251   Cardiac Enzymes: No results for input(s): CKTOTAL, CKMB, CKMBINDEX, TROPONINI in the last 168 hours. BNP (last 3 results) No results for input(s): PROBNP in the last 8760 hours. CBG: No results for input(s): GLUCAP in the last 168 hours. D-Dimer: No results for input(s): DDIMER in the last 72 hours. Hgb A1c: No results for input(s): HGBA1C in the last 72 hours. Lipid Profile: No results for input(s): CHOL, HDL, LDLCALC, TRIG, CHOLHDL, LDLDIRECT in the last 72 hours. Thyroid function studies: No results for input(s): TSH, T4TOTAL, T3FREE, THYROIDAB in the last 72 hours.  Invalid input(s): FREET3 Anemia work up: No results for input(s): VITAMINB12, FOLATE, FERRITIN, TIBC, IRON, RETICCTPCT in the last 72 hours. Sepsis Labs: Recent Labs  Lab 05/20/20 0343 05/21/20 0201 05/22/20 0217   WBC 5.6 3.5* 7.5   Microbiology Recent Results (from the past 240 hour(s))  Resp Panel by RT-PCR (Flu A&B, Covid) Nasopharyngeal Swab     Status: None   Collection Time: 05/20/20 10:04 AM   Specimen: Nasopharyngeal Swab; Nasopharyngeal(NP) swabs in vial transport medium  Result Value Ref Range Status   SARS Coronavirus 2 by RT PCR NEGATIVE NEGATIVE Final    Comment: (NOTE) SARS-CoV-2 target nucleic acids are NOT DETECTED.  The SARS-CoV-2 RNA is generally detectable in upper respiratory specimens during the acute phase of infection. The lowest concentration of SARS-CoV-2 viral copies this assay can detect is 138 copies/mL. A negative result does not preclude SARS-Cov-2 infection and should not be used as the sole basis for treatment or other patient management decisions. A negative result may occur with  improper specimen collection/handling, submission of specimen other than nasopharyngeal swab, presence of viral mutation(s) within the areas targeted by this assay, and inadequate number of viral copies(<138 copies/mL). A negative result must be combined with clinical observations, patient history, and epidemiological information. The expected result is Negative.  Fact Sheet for Patients:  EntrepreneurPulse.com.au  Fact Sheet for Healthcare Providers:  IncredibleEmployment.be  This test is no t yet approved or cleared by the [place] FDA and  has been authorized for detection and/or diagnosis of SARS-CoV-2 by FDA under an Emergency Use Authorization (EUA). This EUA will remain  in effect (meaning this test can be used) for the duration of the COVID-19 declaration under Section 564(b)(1) of the Act, 21 U.S.C.section 360bbb-3(b)(1), unless the authorization is terminated  or revoked sooner.       Influenza A by PCR NEGATIVE NEGATIVE Final   Influenza B by PCR NEGATIVE NEGATIVE Final    Comment: (NOTE) The Xpert Xpress  SARS-CoV-2/FLU/RSV plus assay is intended as an aid in the diagnosis of influenza from Nasopharyngeal swab specimens and should not be used as a sole basis for treatment. Nasal washings and aspirates are unacceptable for Xpert Xpress SARS-CoV-2/FLU/RSV testing.  Fact Sheet for Patients: EntrepreneurPulse.com.au  Fact Sheet for Healthcare Providers: IncredibleEmployment.be  This test is not yet approved or cleared by the [place] FDA and has been authorized for detection and/or diagnosis of SARS-CoV-2 by FDA under an Emergency Use Authorization (EUA). This EUA will remain in effect (meaning this test can be used) for the duration of the COVID-19 declaration under Section 564(b)(1) of the Act, 21 U.S.C. section 360bbb-3(b)(1), unless the authorization is terminated or revoked.  Performed at [facility], [place] [address], [place], [place] [address]   MRSA PCR Screening     Status: None   Collection Time: 05/20/20  3:47 PM   Specimen: Nasal Mucosa; Nasopharyngeal  Result Value Ref Range Status   MRSA by PCR NEGATIVE NEGATIVE Final    Comment:        The GeneXpert MRSA Assay (FDA approved for NASAL specimens only), is one component of a comprehensive MRSA colonization surveillance program. It is not intended to diagnose MRSA infection nor to guide or monitor treatment for MRSA infections. Performed at [facility], [place] [address], [place], [place] [address]      Medications:   . carvedilol  6.25 mg Oral BID WC  . dexamethasone (DECADRON) injection  4 mg Intravenous Q8H  . sacubitril-valsartan  1 tablet Oral QHS  . sodium chloride flush  3 mL Intravenous Q12H   Continuous Infusions: . sodium chloride Stopped (05/21/20 0605)  . heparin 1,150 Units/hr (05/21/20 1530)      LOS: 2 days   Charlynne Cousins  [facility]  05/22/2020, 7:31 AM

## 2020-05-22 NOTE — Progress Notes (Signed)
Inpatient Rehab Admissions Coordinator Note:   Per therapy recommendations, pt was screened for CIR candidacy by Clemens Catholic, [place] CCC-SLP. Pt. Is currently pending sx for epidural tumor and it is difficult to determine needs and abilities until pt. Is post-up. I will not place a CIR consult order at this time, but will re-screen following procedure and pursue a consult order if Pt. Appears an appropriate candidate.    Clemens Catholic, [place], [place] Admissions Coordinator  [phone] ([place]) [phone] (office)

## 2020-05-22 NOTE — Progress Notes (Signed)
Patient tried to get out of bed unassisted; pt educated on policy of having a staff member present; pt verbalized understanding; bed alarm reset to medium setting and curtain pulled back so patient can be seen from the hall.

## 2020-05-22 NOTE — Progress Notes (Signed)
He is doing well.  He is standing and has good strength in his legs.  Plan is for thoracic laminectomy for epidural tumor tomorrow.  N.p.o. after midnight.

## 2020-05-22 NOTE — Progress Notes (Signed)
Had a long discussion with the family today.  Wife and daughter-in-law present.  We discussed the surgery at length.  We discussed typical outcomes and recovery times.  I tried to answer their questions the best my abilities.  They understand the risks of the surgery include but are not limited to bleeding, infection, spinal cord injury, nerve root injury, CSF leak, numbness, weakness, paralysis, iatrogenic instability, need for further surgery, lack of relief of symptoms, worsening symptoms, incomplete resection of tumor, and anesthesia risk including DVT pneumonia MI and death.  They understand that the risk of paralysis without surgery nears 100% over the next few weeks, and that he basically takes all of his risk of paralysis upfront with the surgery but that the risk is likely quite small.  We think it is probably less than 5% may be less than 1% risk of paralysis with surgery.  We have tried to describe the surgery as best we can.  They seem satisfied and ready to move forward tomorrow.  His heparin has been stopped.  Consent has been signed.  Will be n.p.o. after midnight.  Plan on surgery at 7:30 in the morning.

## 2020-05-22 NOTE — Progress Notes (Signed)
Pt had just urinated when RN entered the room.  After reading MRI notes RN bladder scanned for retention.  Bladder scan showed 309 still in the bladder after voiding.

## 2020-05-23 ENCOUNTER — Other Ambulatory Visit: Payer: Self-pay

## 2020-05-23 ENCOUNTER — Encounter (HOSPITAL_COMMUNITY): Payer: Self-pay | Admitting: Internal Medicine

## 2020-05-23 ENCOUNTER — Inpatient Hospital Stay (HOSPITAL_COMMUNITY): Payer: Medicare Other

## 2020-05-23 ENCOUNTER — Inpatient Hospital Stay (HOSPITAL_COMMUNITY): Payer: Medicare Other | Admitting: Anesthesiology

## 2020-05-23 ENCOUNTER — Encounter (HOSPITAL_COMMUNITY)
Admission: EM | Disposition: A | Discharge status: Skilled Nursing Facility | Payer: Self-pay | Source: Home / Self Care | Attending: Neurological Surgery

## 2020-05-23 DIAGNOSIS — D492 Neoplasm of unspecified behavior of bone, soft tissue, and skin: Secondary | ICD-10-CM | POA: Diagnosis present

## 2020-05-23 HISTORY — PX: LAMINECTOMY: SHX219

## 2020-05-23 LAB — TYPE AND SCREEN
ABO/RH(D): A POS
Antibody Screen: NEGATIVE

## 2020-05-23 SURGERY — THORACIC LAMINECTOMY FOR TUMOR
Anesthesia: General | Site: Back

## 2020-05-23 MED ORDER — ONDANSETRON HCL 4 MG/2ML IJ SOLN
INTRAMUSCULAR | Status: DC | PRN
  Administered 2020-05-23: 4 mg via INTRAVENOUS

## 2020-05-23 MED ORDER — FENTANYL CITRATE (PF) 250 MCG/5ML IJ SOLN
INTRAMUSCULAR | Status: AC
  Filled 2020-05-23: qty 5

## 2020-05-23 MED ORDER — THROMBIN 5000 UNITS EX SOLR
CUTANEOUS | Status: AC
  Filled 2020-05-23: qty 5000

## 2020-05-23 MED ORDER — PHENYLEPHRINE HCL-NACL 10-0.9 MG/250ML-% IV SOLN
INTRAVENOUS | Status: AC
  Filled 2020-05-23: qty 250

## 2020-05-23 MED ORDER — LACTATED RINGERS IV SOLN: INTRAVENOUS | Status: DC | PRN

## 2020-05-23 MED ORDER — HYDROCODONE-ACETAMINOPHEN 7.5-325 MG PO TABS
1.0000 | ORAL_TABLET | Freq: Four times a day (QID) | ORAL | Status: DC
  Administered 2020-05-23 – 2020-05-28 (×22): 1 via ORAL
  Filled 2020-05-23 (×22): qty 1

## 2020-05-23 MED ORDER — ONDANSETRON HCL 4 MG/2ML IJ SOLN
INTRAMUSCULAR | Status: AC
  Filled 2020-05-23: qty 2

## 2020-05-23 MED ORDER — MENTHOL 3 MG MT LOZG: 1.0000 | LOZENGE | OROMUCOSAL | Status: DC | PRN

## 2020-05-23 MED ORDER — CHLORHEXIDINE GLUCONATE 0.12 % MT SOLN
OROMUCOSAL | Status: AC
  Filled 2020-05-23: qty 15

## 2020-05-23 MED ORDER — CEFAZOLIN SODIUM-DEXTROSE 2-4 GM/100ML-% IV SOLN
2.0000 g | Freq: Three times a day (TID) | INTRAVENOUS | Status: AC
  Administered 2020-05-23 – 2020-05-24 (×2): 2 g via INTRAVENOUS
  Filled 2020-05-23 (×2): qty 100

## 2020-05-23 MED ORDER — LIDOCAINE HCL (PF) 2 % IJ SOLN
INTRAMUSCULAR | Status: AC
  Filled 2020-05-23: qty 5

## 2020-05-23 MED ORDER — DEXAMETHASONE 4 MG PO TABS
4.0000 mg | ORAL_TABLET | Freq: Four times a day (QID) | ORAL | Status: DC
  Administered 2020-05-23 – 2020-05-28 (×22): 4 mg via ORAL
  Filled 2020-05-23 (×21): qty 1

## 2020-05-23 MED ORDER — METHOCARBAMOL 1000 MG/10ML IJ SOLN
500.0000 mg | Freq: Four times a day (QID) | INTRAVENOUS | Status: DC | PRN
  Filled 2020-05-23: qty 5

## 2020-05-23 MED ORDER — EPHEDRINE SULFATE-NACL 50-0.9 MG/10ML-% IV SOSY
PREFILLED_SYRINGE | INTRAVENOUS | Status: DC | PRN
  Administered 2020-05-23: 5 mg via INTRAVENOUS
  Administered 2020-05-23: 10 mg via INTRAVENOUS

## 2020-05-23 MED ORDER — SODIUM CHLORIDE 0.9 % IV SOLN: 250.0000 mL | INTRAVENOUS | Status: DC

## 2020-05-23 MED ORDER — PROMETHAZINE HCL 25 MG/ML IJ SOLN: 6.2500 mg | INTRAMUSCULAR | Status: DC | PRN

## 2020-05-23 MED ORDER — FENTANYL CITRATE (PF) 250 MCG/5ML IJ SOLN
INTRAMUSCULAR | Status: DC | PRN
  Administered 2020-05-23: 50 ug via INTRAVENOUS
  Administered 2020-05-23: 150 ug via INTRAVENOUS
  Administered 2020-05-23: 50 ug via INTRAVENOUS

## 2020-05-23 MED ORDER — CEFAZOLIN SODIUM 1 G IJ SOLR
INTRAMUSCULAR | Status: AC
  Filled 2020-05-23: qty 20

## 2020-05-23 MED ORDER — ONDANSETRON HCL 4 MG/2ML IJ SOLN: 4.0000 mg | Freq: Four times a day (QID) | INTRAMUSCULAR | Status: DC | PRN

## 2020-05-23 MED ORDER — CEFAZOLIN SODIUM-DEXTROSE 2-3 GM-%(50ML) IV SOLR
INTRAVENOUS | Status: DC | PRN
  Administered 2020-05-23: 2 g via INTRAVENOUS

## 2020-05-23 MED ORDER — LIDOCAINE 2% (20 MG/ML) 5 ML SYRINGE
INTRAMUSCULAR | Status: DC | PRN
  Administered 2020-05-23: 40 mg via INTRAVENOUS

## 2020-05-23 MED ORDER — 0.9 % SODIUM CHLORIDE (POUR BTL) OPTIME
TOPICAL | Status: DC | PRN
  Administered 2020-05-23: 1000 mL

## 2020-05-23 MED ORDER — SENNA 8.6 MG PO TABS
1.0000 | ORAL_TABLET | Freq: Two times a day (BID) | ORAL | Status: DC
  Administered 2020-05-23 – 2020-05-28 (×12): 8.6 mg via ORAL
  Filled 2020-05-23 (×12): qty 1

## 2020-05-23 MED ORDER — ALBUMIN HUMAN 5 % IV SOLN: INTRAVENOUS | Status: DC | PRN

## 2020-05-23 MED ORDER — MEPERIDINE HCL 25 MG/ML IJ SOLN: 6.2500 mg | INTRAMUSCULAR | Status: DC | PRN

## 2020-05-23 MED ORDER — POTASSIUM CHLORIDE IN NACL 20-0.9 MEQ/L-% IV SOLN
INTRAVENOUS | Status: DC
  Filled 2020-05-23: qty 1000

## 2020-05-23 MED ORDER — METHOCARBAMOL 500 MG PO TABS: 500.0000 mg | ORAL_TABLET | Freq: Four times a day (QID) | ORAL | Status: DC | PRN

## 2020-05-23 MED ORDER — SODIUM CHLORIDE 0.9% FLUSH
3.0000 mL | INTRAVENOUS | Status: DC | PRN
  Administered 2020-05-25: 3 mL via INTRAVENOUS

## 2020-05-23 MED ORDER — BUPIVACAINE HCL (PF) 0.25 % IJ SOLN
INTRAMUSCULAR | Status: AC
  Filled 2020-05-23: qty 30

## 2020-05-23 MED ORDER — EPHEDRINE 5 MG/ML INJ
INTRAVENOUS | Status: AC
  Filled 2020-05-23: qty 10

## 2020-05-23 MED ORDER — THROMBIN 20000 UNITS EX SOLR
CUTANEOUS | Status: AC
  Filled 2020-05-23: qty 20000

## 2020-05-23 MED ORDER — ROCURONIUM BROMIDE 10 MG/ML (PF) SYRINGE
PREFILLED_SYRINGE | INTRAVENOUS | Status: DC | PRN
  Administered 2020-05-23: 60 mg via INTRAVENOUS
  Administered 2020-05-23: 20 mg via INTRAVENOUS

## 2020-05-23 MED ORDER — ACETAMINOPHEN 650 MG RE SUPP: 650.0000 mg | RECTAL | Status: DC | PRN

## 2020-05-23 MED ORDER — ROCURONIUM BROMIDE 10 MG/ML (PF) SYRINGE
PREFILLED_SYRINGE | INTRAVENOUS | Status: AC
  Filled 2020-05-23: qty 10

## 2020-05-23 MED ORDER — MIDAZOLAM HCL 2 MG/2ML IJ SOLN: 0.5000 mg | Freq: Once | INTRAMUSCULAR | Status: DC | PRN

## 2020-05-23 MED ORDER — ONDANSETRON HCL 4 MG PO TABS: 4.0000 mg | ORAL_TABLET | Freq: Four times a day (QID) | ORAL | Status: DC | PRN

## 2020-05-23 MED ORDER — PROPOFOL 10 MG/ML IV BOLUS
INTRAVENOUS | Status: DC | PRN
  Administered 2020-05-23: 80 mg via INTRAVENOUS

## 2020-05-23 MED ORDER — PROPOFOL 10 MG/ML IV BOLUS
INTRAVENOUS | Status: AC
  Filled 2020-05-23: qty 20

## 2020-05-23 MED ORDER — HYDROMORPHONE HCL 1 MG/ML IJ SOLN: 0.2500 mg | INTRAMUSCULAR | Status: DC | PRN

## 2020-05-23 MED ORDER — BUPIVACAINE HCL (PF) 0.25 % IJ SOLN
INTRAMUSCULAR | Status: DC | PRN
  Administered 2020-05-23: 5 mL

## 2020-05-23 MED ORDER — MORPHINE SULFATE (PF) 2 MG/ML IV SOLN: 2.0000 mg | INTRAVENOUS | Status: DC | PRN

## 2020-05-23 MED ORDER — PHENYLEPHRINE HCL-NACL 10-0.9 MG/250ML-% IV SOLN
INTRAVENOUS | Status: DC | PRN
  Administered 2020-05-23: 25 ug/min via INTRAVENOUS

## 2020-05-23 MED ORDER — DEXAMETHASONE SODIUM PHOSPHATE 10 MG/ML IJ SOLN
INTRAMUSCULAR | Status: DC | PRN
  Administered 2020-05-23: 5 mg via INTRAVENOUS

## 2020-05-23 MED ORDER — ACETAMINOPHEN 325 MG PO TABS: 650.0000 mg | ORAL_TABLET | ORAL | Status: DC | PRN

## 2020-05-23 MED ORDER — THROMBIN 5000 UNITS EX SOLR
OROMUCOSAL | Status: DC | PRN
  Administered 2020-05-23: 5 mL via TOPICAL

## 2020-05-23 MED ORDER — OXYCODONE HCL 5 MG/5ML PO SOLN: 5.0000 mg | Freq: Once | ORAL | Status: DC | PRN

## 2020-05-23 MED ORDER — SUGAMMADEX SODIUM 200 MG/2ML IV SOLN
INTRAVENOUS | Status: DC | PRN
  Administered 2020-05-23: 200 mg via INTRAVENOUS

## 2020-05-23 MED ORDER — PHENOL 1.4 % MT LIQD: 1.0000 | OROMUCOSAL | Status: DC | PRN

## 2020-05-23 MED ORDER — DEXAMETHASONE SODIUM PHOSPHATE 10 MG/ML IJ SOLN
INTRAMUSCULAR | Status: AC
  Filled 2020-05-23: qty 1

## 2020-05-23 MED ORDER — SODIUM CHLORIDE 0.9% FLUSH
3.0000 mL | Freq: Two times a day (BID) | INTRAVENOUS | Status: DC
  Administered 2020-05-23 – 2020-05-26 (×7): 3 mL via INTRAVENOUS

## 2020-05-23 MED ORDER — THROMBIN 20000 UNITS EX SOLR
CUTANEOUS | Status: DC | PRN
  Administered 2020-05-23: 20 mL via TOPICAL

## 2020-05-23 MED ORDER — DEXAMETHASONE SODIUM PHOSPHATE 4 MG/ML IJ SOLN
4.0000 mg | Freq: Four times a day (QID) | INTRAMUSCULAR | Status: DC
  Administered 2020-05-24: 4 mg via INTRAVENOUS
  Filled 2020-05-23 (×2): qty 1

## 2020-05-23 MED ORDER — OXYCODONE HCL 5 MG PO TABS: 5.0000 mg | ORAL_TABLET | Freq: Once | ORAL | Status: DC | PRN

## 2020-05-23 MED ORDER — LISINOPRIL 20 MG PO TABS
20.0000 mg | ORAL_TABLET | Freq: Every day | ORAL | Status: DC
  Administered 2020-05-23: 20 mg via ORAL
  Filled 2020-05-23: qty 1

## 2020-05-23 SURGICAL SUPPLY — 61 items
BAND RUBBER #18 3X1/16 STRL (MISCELLANEOUS) ×4 IMPLANT
BENZOIN TINCTURE PRP APPL 2/3 (GAUZE/BANDAGES/DRESSINGS) ×2 IMPLANT
BLADE CLIPPER SURG (BLADE) IMPLANT
BUR CARBIDE MATCH 3.0 (BURR) IMPLANT
CANISTER SUCT 3000ML PPV (MISCELLANEOUS) ×2 IMPLANT
CLSR STERI-STRIP ANTIMIC 1/2X4 (GAUZE/BANDAGES/DRESSINGS) ×4 IMPLANT
CNTNR URN SCR LID CUP LEK RST (MISCELLANEOUS) ×1 IMPLANT
CONT SPEC 4OZ STRL OR WHT (MISCELLANEOUS) ×2
COVER WAND RF STERILE (DRAPES) ×2 IMPLANT
DERMABOND ADVANCED (GAUZE/BANDAGES/DRESSINGS) ×1
DERMABOND ADVANCED .7 DNX12 (GAUZE/BANDAGES/DRESSINGS) ×1 IMPLANT
DIFFUSER DRILL AIR PNEUMATIC (MISCELLANEOUS) ×2 IMPLANT
DRAPE C-ARM 42X72 X-RAY (DRAPES) IMPLANT
DRAPE LAPAROTOMY 100X72 PEDS (DRAPES) IMPLANT
DRAPE LAPAROTOMY 100X72X124 (DRAPES) ×2 IMPLANT
DRAPE MICROSCOPE LEICA (MISCELLANEOUS) ×2 IMPLANT
DRAPE SURG 17X23 STRL (DRAPES) ×4 IMPLANT
DRSG OPSITE POSTOP 4X8 (GAUZE/BANDAGES/DRESSINGS) ×2 IMPLANT
ELECT REM PT RETURN 9FT ADLT (ELECTROSURGICAL) ×2
ELECTRODE REM PT RTRN 9FT ADLT (ELECTROSURGICAL) ×1 IMPLANT
EVACUATOR 1/8 PVC DRAIN (DRAIN) ×2 IMPLANT
GAUZE 4X4 16PLY RFD (DISPOSABLE) IMPLANT
GAUZE SPONGE 4X4 12PLY STRL (GAUZE/BANDAGES/DRESSINGS) ×2 IMPLANT
GLOVE BIO SURGEON STRL SZ7 (GLOVE) ×8 IMPLANT
GLOVE BIO SURGEON STRL SZ8 (GLOVE) ×2 IMPLANT
GLOVE BIOGEL PI IND STRL 7.0 (GLOVE) ×1 IMPLANT
GLOVE BIOGEL PI IND STRL 7.5 (GLOVE) ×1 IMPLANT
GLOVE BIOGEL PI INDICATOR 7.0 (GLOVE) ×1
GLOVE BIOGEL PI INDICATOR 7.5 (GLOVE) ×1
GOWN STRL REUS W/ TWL LRG LVL3 (GOWN DISPOSABLE) ×2 IMPLANT
GOWN STRL REUS W/ TWL XL LVL3 (GOWN DISPOSABLE) ×1 IMPLANT
GOWN STRL REUS W/TWL 2XL LVL3 (GOWN DISPOSABLE) IMPLANT
GOWN STRL REUS W/TWL LRG LVL3 (GOWN DISPOSABLE) ×4
GOWN STRL REUS W/TWL XL LVL3 (GOWN DISPOSABLE) ×2
HEMOSTAT POWDER KIT SURGIFOAM (HEMOSTASIS) ×2 IMPLANT
HEMOSTAT SURGICEL 2X14 (HEMOSTASIS) IMPLANT
KIT BASIN OR (CUSTOM PROCEDURE TRAY) ×2 IMPLANT
KIT TURNOVER KIT B (KITS) ×2 IMPLANT
NEEDLE HYPO 22GX1.5 SAFETY (NEEDLE) ×2 IMPLANT
NEEDLE SPNL 20GX3.5 QUINCKE YW (NEEDLE) ×2 IMPLANT
NS IRRIG 1000ML POUR BTL (IV SOLUTION) ×2 IMPLANT
PACK LAMINECTOMY NEURO (CUSTOM PROCEDURE TRAY) ×2 IMPLANT
PATTIES SURGICAL .5 X3 (DISPOSABLE) ×2 IMPLANT
SPONGE LAP 4X18 RFD (DISPOSABLE) IMPLANT
SPONGE SURGIFOAM ABS GEL 100 (HEMOSTASIS) ×2 IMPLANT
STAPLER VISISTAT 35W (STAPLE) ×2 IMPLANT
STRIP CLOSURE SKIN 1/2X4 (GAUZE/BANDAGES/DRESSINGS) ×2 IMPLANT
SUT BONE WAX W31G (SUTURE) IMPLANT
SUT ETHILON 4 0 PS 2 18 (SUTURE) ×2 IMPLANT
SUT NURALON 4 0 TR CR/8 (SUTURE) IMPLANT
SUT PROLENE 6 0 BV (SUTURE) IMPLANT
SUT SILK 2 0 TIES 10X30 (SUTURE) ×2 IMPLANT
SUT VIC AB 0 CT1 18XCR BRD8 (SUTURE) ×2 IMPLANT
SUT VIC AB 0 CT1 8-18 (SUTURE) ×4
SUT VIC AB 2-0 CP2 18 (SUTURE) ×4 IMPLANT
SUT VIC AB 3-0 SH 8-18 (SUTURE) ×4 IMPLANT
SUT VICRYL 4-0 PS2 18IN ABS (SUTURE) IMPLANT
TOWEL GREEN STERILE (TOWEL DISPOSABLE) ×2 IMPLANT
TOWEL GREEN STERILE FF (TOWEL DISPOSABLE) ×2 IMPLANT
TRAY FOLEY MTR SLVR 16FR STAT (SET/KITS/TRAYS/PACK) IMPLANT
WATER STERILE IRR 1000ML POUR (IV SOLUTION) ×2 IMPLANT

## 2020-05-23 NOTE — Anesthesia Procedure Notes (Signed)
Arterial Line Insertion Start/End11/28/2021 7:20 AM Performed by: Alain Marion, CRNA, CRNA  Preanesthetic checklist: patient identified, IV checked, site marked, risks and benefits discussed, surgical consent, monitors and equipment checked, pre-op evaluation, timeout performed and anesthesia consent Lidocaine 1% used for infiltration Right, radial was placed Catheter size: 20 G Hand hygiene performed  and maximum sterile barriers used   Attempts: 1 Procedure performed without using ultrasound guided technique. Following insertion, dressing applied and Biopatch. Post procedure assessment: normal and unchanged  Patient tolerated the procedure well with no immediate complications.

## 2020-05-23 NOTE — Op Note (Signed)
05/23/2020  10:43 AM  PATIENT:  Patrick [name]  86 y.o. male  PRE-OPERATIVE DIAGNOSIS: Dorsal epidural metastatic tumor T6-T9  POST-OPERATIVE DIAGNOSIS:  same  PROCEDURE: Thoracic laminectomy, medial facetectomy and foraminotomy T6-T7-T8 for removal of dorsal epidural tumor  SURGEON:  Sherley Bounds, MD  ASSISTANTS: Glenford Peers, FNP  ANESTHESIA:   General  EBL: 300 ml  Total I/O In: 1450 [I.V.:1200; IV Piggyback:250] Out: 300 [Blood:300]  BLOOD ADMINISTERED: none  DRAINS: med hemovac  SPECIMEN:  none  INDICATION FOR PROCEDURE: This patient presented with poor control of his legs and a fall. Imaging showed large dorsal epidural mass with cord compression and myelopathy T6-T9.  He has a history of metastatic prostate cancer. Recommended thoracic laminectomy for removal of tumor once he had been off of his Eliquis for 3 to 4 days. Patient understood the risks, benefits, and alternatives and potential outcomes and wished to proceed.  PROCEDURE DETAILS: The patient was taken to the operating room and after induction of adequate generalized endotracheal anesthesia, the patient was rolled into the prone position on the Wilson frame and all pressure points were padded.  We used AP fluoroscopy to mark from the bottom of T5 to the T9 pedicle to mark her incision.  The thoracic and lumbar region was cleaned and then prepped with DuraPrep and draped in the usual sterile fashion. 5 cc of local anesthesia was injected and then a dorsal midline incision was made and carried down to the lumbo sacral fascia. The fascia was opened and the paraspinous musculature was taken down in a subperiosteal fashion to expose T6-T9 bilaterally.  I removed the spinous processes of T5-T8.  I used a combination of the high-speed drill to drill down to an eggshell at each level and the Kerrison punches to perform a laminectomy, medial facetectomy, and foraminotomy at T5-6 down to T8-9.  I wanted to be above the T6  pedicle and down to the T9 pedicle based on preoperative imaging.   The underlying yellow ligament was opened and removed in a piecemeal fashion to expose the underlying dura and obvious epidural tumor. I undercut the lateral recess and dissected down until I was medial to and distal to the pedicle at each level.  The tumor was soft but it was stuck to the dura.  We spent considerable time peeling it away with a nerve hook and a micropituitary rongeur.  We undercut the lateral recesses remove more tumor from the lateral recesses where it circumferentially went around the cord.  This was especially true at T7.  We did our decompression until we were obviously above the tumor and obviously below the tumor and there was just epidural fat.  We started at each end and work the tumor toward T7.  We gently dissected between the dura and the tumor as best we could.  Obviously we had to leave some tumor in the lateral recesses and there was thin areas of tumor left on the dura that we could not get off the dura.  However, these areas over the dura were paperthin.  We felt like we got an excellent resection of the tumor mass that we could reach safely.  I irrigated with saline solution containing bacitracin. Achieved hemostasis with bipolar cautery and Surgifoam in the gutters, lined the dura with Gelfoam, is to medium Hemovac drain through a separate stab incision, and then closed the fascia with 0 Vicryl. I closed the subcutaneous tissues with 2-0 Vicryl and the subcuticular tissues with 3-0 Vicryl.  The skin was then closed with benzoin and Steri-Strips. The drapes were removed, a sterile dressing was applied.  My nurse practitioner was involved in the exposure, safe resection of tumor.. the patient was awakened from general anesthesia and transferred to the recovery room in stable condition. At the end of the procedure all sponge, needle and instrument counts were correct.    PLAN OF CARE: Admit to inpatient    PATIENT DISPOSITION:  PACU - hemodynamically stable.   Delay start of Pharmacological VTE agent (>24hrs) due to surgical blood loss or risk of bleeding:  yes

## 2020-05-23 NOTE — Anesthesia Procedure Notes (Signed)
Procedure Name: Intubation Date/Time: 05/23/2020 8:01 AM Performed by: Alain Marion, CRNA Pre-anesthesia Checklist: Patient identified, Emergency Drugs available, Suction available and Patient being monitored Patient Re-evaluated:Patient Re-evaluated prior to induction Oxygen Delivery Method: Circle System Utilized Preoxygenation: Pre-oxygenation with 100% oxygen Induction Type: IV induction Ventilation: Mask ventilation without difficulty Laryngoscope Size: Miller and 2 Grade View: Grade I Tube type: Oral Tube size: 7.5 mm Number of attempts: 1 Airway Equipment and Method: Stylet Placement Confirmation: ETT inserted through vocal cords under direct vision,  positive ETCO2 and breath sounds checked- equal and bilateral Secured at: 22 cm Tube secured with: Tape Dental Injury: Teeth and Oropharynx as per pre-operative assessment

## 2020-05-23 NOTE — Transfer of Care (Signed)
Immediate Anesthesia Transfer of Care Note  Patient: Patrick [name]  Procedure(s) Performed: THORACIC LAMINECTOMY FOR EPIDURAL TUMOR (N/A Back)  Patient Location: PACU  Anesthesia Type:General  Level of Consciousness: alert  and oriented  Airway & Oxygen Therapy: Patient Spontanous Breathing and Patient connected to face mask oxygen  Post-op Assessment: Report given to RN and Post -op Vital signs reviewed and stable  Post vital signs: Reviewed and stable  Last Vitals:  Vitals Value Taken Time  BP 151/78 05/23/20 1053  Temp    Pulse 80 05/23/20 1059  Resp 21 05/23/20 1058  SpO2 99 % 05/23/20 1059  Vitals shown include unvalidated device data.  Last Pain:  Vitals:   05/23/20 0509  TempSrc: Oral  PainSc:       Patients Stated Pain Goal: 0 (20/74/09 7964)  Complications: No complications documented.

## 2020-05-23 NOTE — Anesthesia Preprocedure Evaluation (Addendum)
Anesthesia Evaluation  Patient identified by MRN, date of birth, ID band Patient awake    Reviewed: Allergy & Precautions, NPO status , Patient's Chart, lab work & pertinent test results, reviewed documented beta blocker date and time   History of Anesthesia Complications Negative for: history of anesthetic complications  Airway Mallampati: II  TM Distance: >3 FB Neck ROM: Full    Dental  (+) Dental Advisory Given   Pulmonary sleep apnea (does not use CPAP) , former smoker,  05/20/2020 SARS coronavirus NEG   breath sounds clear to auscultation       Cardiovascular hypertension, Pt. on medications and Pt. on home beta blockers (-) angina+CHF (non-ischemic cardiomyopathy)  + dysrhythmias Atrial Fibrillation + Valvular Problems/Murmurs AS  Rhythm:Regular Rate:Normal  '20 Stress: EF 40-45%, mild-mod inferior fixed defect  '20 ECHO: EF 40-45%, mod AS with mean grad 28 mmHg, mild MR, mod TR   Neuro/Psych negative neurological ROS     GI/Hepatic negative GI ROS, Neg liver ROS,   Endo/Other    Renal/GU negative Renal ROS   Metastatic prostate cancer    Musculoskeletal   Abdominal   Peds  Hematology  (+) Blood dyscrasia (Hb 11.4), anemia ,   Anesthesia Other Findings   Reproductive/Obstetrics                            Anesthesia Physical Anesthesia Plan  ASA: IV  Anesthesia Plan: General   Post-op Pain Management:    Induction: Intravenous  PONV Risk Score and Plan: 3 and Ondansetron, Dexamethasone and Treatment may vary due to age or medical condition  Airway Management Planned: Oral ETT  Additional Equipment: Arterial line  Intra-op Plan:   Post-operative Plan: Extubation in OR  Informed Consent: I have reviewed the patients History and Physical, chart, labs and discussed the procedure including the risks, benefits and alternatives for the proposed anesthesia with the patient or  authorized representative who has indicated his/her understanding and acceptance.   Patient has DNR.  Discussed DNR with patient and Suspend DNR.   Dental advisory given  Plan Discussed with: CRNA and Surgeon  Anesthesia Plan Comments:        Anesthesia Quick Evaluation

## 2020-05-23 NOTE — Progress Notes (Signed)
Pt found to still have A-line in right wrist. This RN contacted Dr. Ronnald Ramp and orders were given to remove. A-line removed with no complications. Catheter intact, and pressure held by this RN for 5 minutes.   Justice Rocher, RN

## 2020-05-23 NOTE — Progress Notes (Signed)
Pt arrived back to unit from PACU. Pt alert and orientedx4, no numbness or tingling, and full sensation in extremeties. Vital signs taken and WNL and assessment completed and charted.  Justice Rocher, RN

## 2020-05-23 NOTE — Progress Notes (Signed)
Pt seen in holding, he's ready for surgery, all questions answered to his satisfaction. Plan TL for tumor.

## 2020-05-23 NOTE — Anesthesia Postprocedure Evaluation (Signed)
Anesthesia Post Note  Patient: Patrick [name]  Procedure(s) Performed: THORACIC LAMINECTOMY FOR EPIDURAL TUMOR (N/A Back)     Patient location during evaluation: PACU Anesthesia Type: General Level of consciousness: awake and alert, patient cooperative and oriented Pain management: pain level controlled Vital Signs Assessment: post-procedure vital signs reviewed and stable Respiratory status: spontaneous breathing, nonlabored ventilation, respiratory function stable and patient connected to nasal cannula oxygen Cardiovascular status: blood pressure returned to baseline and stable Postop Assessment: no apparent nausea or vomiting Anesthetic complications: no   No complications documented.  Last Vitals:  Vitals:   05/23/20 1207 05/23/20 1608  BP: (!) 153/90 138/64  Pulse: 74 74  Resp: 20 16  Temp: 36.8 C 36.6 C  SpO2: 99% 99%    Last Pain:  Vitals:   05/23/20 1608  TempSrc: Oral  PainSc:                  Rayleen Wyrick,E. Vickye Astorino

## 2020-05-24 ENCOUNTER — Encounter (HOSPITAL_COMMUNITY): Payer: Self-pay | Admitting: Neurological Surgery

## 2020-05-24 ENCOUNTER — Ambulatory Visit
Admit: 2020-05-24 | Discharge: 2020-05-24 | Disposition: A | Discharge status: Home / Self Care | Payer: Medicare Other | Attending: Radiation Oncology | Admitting: Radiation Oncology

## 2020-05-24 ENCOUNTER — Other Ambulatory Visit: Payer: Self-pay | Admitting: Radiation Therapy

## 2020-05-24 DIAGNOSIS — I160 Hypertensive urgency: Secondary | ICD-10-CM | POA: Diagnosis not present

## 2020-05-24 DIAGNOSIS — I4819 Other persistent atrial fibrillation: Secondary | ICD-10-CM | POA: Diagnosis not present

## 2020-05-24 DIAGNOSIS — G9529 Other cord compression: Secondary | ICD-10-CM

## 2020-05-24 DIAGNOSIS — Z789 Other specified health status: Secondary | ICD-10-CM

## 2020-05-24 DIAGNOSIS — Z515 Encounter for palliative care: Secondary | ICD-10-CM

## 2020-05-24 DIAGNOSIS — C7951 Secondary malignant neoplasm of bone: Secondary | ICD-10-CM

## 2020-05-24 DIAGNOSIS — C61 Malignant neoplasm of prostate: Secondary | ICD-10-CM

## 2020-05-24 DIAGNOSIS — R531 Weakness: Secondary | ICD-10-CM

## 2020-05-24 DIAGNOSIS — R29898 Other symptoms and signs involving the musculoskeletal system: Secondary | ICD-10-CM | POA: Diagnosis not present

## 2020-05-24 DIAGNOSIS — Z66 Do not resuscitate: Secondary | ICD-10-CM

## 2020-05-24 DIAGNOSIS — Z7189 Other specified counseling: Secondary | ICD-10-CM

## 2020-05-24 LAB — BASIC METABOLIC PANEL
Anion gap: 7 (ref 5–15)
BUN: 26 mg/dL — ABNORMAL HIGH (ref 8–23)
CO2: 26 mmol/L (ref 22–32)
Calcium: 8.7 mg/dL — ABNORMAL LOW (ref 8.9–10.3)
Chloride: 101 mmol/L (ref 98–111)
Creatinine, Ser: 1.07 mg/dL (ref 0.61–1.24)
GFR, Estimated: >60 mL/min (ref >60)
Glucose, Bld: 140 mg/dL — ABNORMAL HIGH (ref 70–99)
Potassium: 4.1 mmol/L (ref 3.5–5.1)
Sodium: 134 mmol/L — ABNORMAL LOW (ref 135–145)

## 2020-05-24 MED ORDER — AMLODIPINE BESYLATE 2.5 MG PO TABS
2.5000 mg | ORAL_TABLET | Freq: Every day | ORAL | Status: DC
  Administered 2020-05-24 – 2020-05-26 (×3): 2.5 mg via ORAL
  Filled 2020-05-24 (×3): qty 1

## 2020-05-24 NOTE — Evaluation (Signed)
Occupational Therapy Re-Evaluation Patient Details Name: Patrick [name] MRN: [id number] DOB: [birth year] Today's Date: 05/24/2020    History of Present Illness This 86 y.o. male admitted after sustaining a fall.  Spinal cord compression relieved s/p thoracic laminectomy with medial fasciotomy and foraminotomy T6-7, T8 and removal of tumor on 11/28. PMHx includes: metastatic prostate CA with mets of the pelvis, A-Fib, Rt ulnar neuropathy, nonischemic cardiomyopathy, moderate aortic stenosis, LBBB, HTN, chronic systolic dysfunction.   Clinical Impression   Pt s/p spinal sx with back precautions. PT lives at home with spouse and reports mostly independent other than using a crutch for mobility. Pt currently, limited by decreased awareness of deficits, decreased strength, decreased activity tolerance and decreased ability to care for self safely. Pt limited by inability to reach for LB Dressing and requires additional education for LB ADL. Pt requiring education for back precautions; OT education provided. Pt set-upA for UB ADL and minA for LB ADL. Pt continues to progress with OOB ADL and mobility with RW. Pt with some ataxic gait noted and pt unaware. Pt would greatly benefit from continued OT skilled services for ADL, mobility and safety in CIR setting. OT following acutely.    Follow Up Recommendations  CIR;Supervision/Assistance - 24 hour    Equipment Recommendations  Tub/shower bench;3 in 1 bedside commode    Recommendations for Other Services       Precautions / Restrictions Precautions Precautions: Fall Precaution Booklet Issued: No Precaution Comments:  impulsive  Restrictions Weight Bearing Restrictions: No      Mobility Bed Mobility               General bed mobility comments: OOB on arrival    Transfers Overall transfer level: Needs assistance Equipment used: Rolling walker (2 wheeled) Transfers: Sit to/from Stand Sit to Stand: Min assist Stand pivot transfers:  Min assist       General transfer comment: Worked on coming forward on standing, centering mass and use of UE's for soft/controlled sitting.  Pt's pivotal steps mildly ataxic, catching his L foot on his right foot several times.    Balance Overall balance assessment: Needs assistance Sitting-balance support: Feet supported Sitting balance-Leahy Scale: Good     Standing balance support: Bilateral upper extremity supported Standing balance-Leahy Scale: Poor                             ADL either performed or assessed with clinical judgement   ADL Overall ADL's : Needs assistance/impaired Eating/Feeding: Independent   Grooming: Wash/dry hands;Wash/dry face;Oral care;Min guard;Standing   Upper Body Bathing: Set up;Sitting   Lower Body Bathing: Minimal assistance;Sitting/lateral leans;Sit to/from stand   Upper Body Dressing : Set up;Sitting   Lower Body Dressing: Minimal assistance;Sit to/from stand Lower Body Dressing Details (indicate cue type and reason): Pt education on figure 4 technique for LB ADL. Toilet Transfer: Minimal assistance;Ambulation;RW   Toileting- Clothing Manipulation and Hygiene: Minimal assistance;Cueing for safety;Sitting/lateral lean;Sit to/from stand;Adhering to back precautions       Functional mobility during ADLs: Minimal assistance;Cueing for safety;Rolling walker General ADL Comments: Pt limited by decreased awareness of deficits, decreased strength, decreased activity tolerance and decreased ability to care for self safely. Pt limited by inability to reach for LB Dressing and requires additional education for LB ADL. Pt requiring education for back precautions; OT education provided.     Vision Baseline Vision/History: Wears glasses Wears Glasses: At all times Patient Visual Report: No change from  baseline Vision Assessment?: No apparent visual deficits     Perception     Praxis      Pertinent Vitals/Pain Pain Assessment:  No/denies pain     Hand Dominance Right   Extremity/Trunk Assessment Upper Extremity Assessment Upper Extremity Assessment: Generalized weakness;RUE deficits/detail;LUE deficits/detail RUE Deficits / Details: 3+/5 MM grade FF to 120*, AROM shoulder through digits, WFLs. LUE Deficits / Details: 3+/5 MM grade FF to 120*, AROM shoulder through digits, WFLs.   Lower Extremity Assessment Lower Extremity Assessment: Generalized weakness;Defer to PT evaluation   Cervical / Trunk Assessment Cervical / Trunk Assessment: Other exceptions Cervical / Trunk Exceptions: s/p spinal sx for removal of tumor   Communication Communication Communication: No difficulties   Cognition Arousal/Alertness: Awake/alert Behavior During Therapy: WFL for tasks assessed/performed Overall Cognitive Status: Impaired/Different from baseline Area of Impairment: Problem solving;Awareness                           Awareness: Intellectual Problem Solving: Slow processing;Requires verbal cues;Requires tactile cues General Comments: decreased awareness of gait deficits   General Comments  Pt VSS on RA; 160/74 BP taken in recliner    Exercises     Shoulder Instructions      Home Living Family/patient expects to be discharged to:: Private residence Living Arrangements: Spouse/significant other Available Help at Discharge: Family;Available 24 hours/day Type of Home: House Home Access: Stairs to enter [employer] of Steps: 4 to enter; den has 2 steps up Entrance Stairs-Rails: Left;Right;Can reach both Home Layout: Multi-level;Able to live on main level with bedroom/bathroom Alternate Level Stairs-Number of Steps: full flight    Bathroom Shower/Tub: Tub/shower unit;Door   [employer] Toilet: Standard     Home Equipment: Crutches          Prior Functioning/Environment Level of Independence: Needs assistance  Gait / Transfers Assistance Needed: started to use crutches ~2 weeks ago.   Prior to that was ambulating with 1 crutch due to pelvic met  ADL's / Homemaking Assistance Needed: modified independence            OT Problem List: Decreased strength;Decreased activity tolerance;Impaired balance (sitting and/or standing);Decreased cognition;Decreased safety awareness;Decreased knowledge of use of DME or AE;Pain      OT Treatment/Interventions: Self-care/ADL training;Energy conservation;DME and/or AE instruction;Therapeutic activities;Cognitive remediation/compensation;Patient/family education;Balance training    OT Goals(Current goals can be found in the care plan section) Acute Rehab OT Goals Patient Stated Goal: to get better  OT Goal Formulation: With patient/family Time For Goal Achievement: 06/04/20 Potential to Achieve Goals: Good  OT Frequency: Min 2X/week   Barriers to D/C:            Co-evaluation              AM-PAC OT "6 Clicks" Daily Activity     Outcome Measure Help from another person eating meals?: None Help from another person taking care of personal grooming?: A Little Help from another person toileting, which includes using toliet, bedpan, or urinal?: A Lot Help from another person bathing (including washing, rinsing, drying)?: A Little Help from another person to put on and taking off regular upper body clothing?: A Little Help from another person to put on and taking off regular lower body clothing?: A Lot 6 Click Score: 17   End of Session Equipment Utilized During Treatment: Gait belt;Rolling walker Nurse Communication: Mobility status  Activity Tolerance: Patient tolerated treatment well Patient left: Other (comment) (up with PT)  OT Visit  Diagnosis: Unsteadiness on feet (R26.81);Muscle weakness (generalized) (M62.81)                Time: 1102-1117 OT Time Calculation (min): 19 min Charges:  OT General Charges $OT Visit: 1 Visit OT Evaluation $OT Eval Moderate Complexity: 1 Mod  Jefferey Pica, OTR/L Acute Rehabilitation  Services Pager: [phone] Office: [phone]   Theodosia Bahena C 05/24/2020, 4:46 PM
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
## 2020-05-24 NOTE — Consult Note (Signed)
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 Min  Result Date: 05/23/2020 CLINICAL DATA:  Thoracic laminectomy. EXAM: DG C-ARM 1-60 MIN FLUOROSCOPY TIME:  Fluoroscopy Time:  7.6 seconds Radiation Exposure Index (if provided by the fluoroscopic device): 2.78 mGy Number of Acquired Spot Images: 0 COMPARISON:  September 18, 2019 FINDINGS: Intraoperative fluoroscopic image of the thoracic spine demonstrates placement of marking devise at the level of T11 vertebral body. IMPRESSION: Intraoperative fluoroscopic image of the thoracic spine demonstrates placement of marking device at the level of T11 vertebral body. Electronically Signed   By: Fidela Salisbury M.D.   On: 05/23/2020 11:12   DG Hip Unilat W or Wo Pelvis 2-3 Views Right  Result Date: 05/19/2020 CLINICAL DATA:  Right hip and low back pain after a fall this evening. EXAM: DG HIP (WITH OR WITHOUT PELVIS) 2-3V RIGHT COMPARISON:  None. FINDINGS: No evidence of acute fracture or dislocation involving the pelvis or right hip. SI joints and symphysis pubis are nondisplaced. Narrowing and sclerosis in the SI joints may represent partial ankylosis or degenerative change. No focal bone lesion or bone destruction. Soft tissues are  unremarkable. IMPRESSION: No acute bony abnormalities. Electronically Signed   By: Oren Beckmann.D.  On: 05/19/2020 23:50       IMPRESSION/PLAN: 1. 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 Dr. Lisbeth Renshaw has reviewed his case and would recommend postoperative radiotherapy with palliativee intent. We discussed treatment delivery and logistics and the patient is interested in proceeding. We will plan to see him in about 2-3 weeks to allow for healing to the thoracic surgical site, but Dr. Lisbeth Renshaw would plan to give 10 fractions of treatment to the postoperative site in the T spine between T6-9 and adjacent ribs, as well as a second isocenter beign the sacrum. We will review more treatment related details when he comes back to our clinic and proceed with simulation that day as well. He is in agreement with this plan, and will have these appts at the time of his discharge. We will also contact [facility] for more detailed treatment records, specifically [facility] for prior dosimetry records. Dr. Ronnald Ramp is also referring the patient to Dr. Alen Blew as well to see him in the outpatient setting and to transfer care from Dr. Theda Sers so he can see Dr. Alen Blew closer to home.  In a visit lasting 60 minutes, greater than 50% of the time was spent by phone and in floor time discussing the patient's condition, in preparation for the discussion, and coordinating the patient's care.     Carola Rhine, PAC

## 2020-05-24 NOTE — Progress Notes (Signed)
Palliative Medicine RN Note: Our team continues to follow. Note that pt is recovering well from surgery. TOC order still pending to set up home palliative care, as patient is not ready for hospice.   We will continue to monitor for changes/decline.  Marjie Skiff Leahmarie Gasiorowski, RN, BSN, [facility] Palliative Medicine Team 05/24/2020 2:05 PM Office [phone]

## 2020-05-24 NOTE — Progress Notes (Signed)
Physical Therapy Treatment Patient Details Name: Patrick [name] MRN: [id number] DOB: 1931/12/13 Today's Date: 05/24/2020    History of Present Illness This 86 y.o. male admitted after sustaining a fall.  Spinal cord compression relieved s/p thoracic laminectomy with medial fasciotomy and foraminotomy T6-7, T8 and removal of tumor on 11/28. PMHx includes: metastatic prostate CA with mets of the pelvis, A-Fib, Rt ulnar neuropathy, nonischemic cardiomyopathy, moderate aortic stenosis, LBBB, HTN, chronic systolic dysfunction.    PT Comments    Pt continues to be uncoordinated in B LE's L>R and greater in LE's than UE's.  Emphasis on transfer technique/safety and gait stability/quality, which have improved, but still ataxic/uncoordinated post surgery.   Follow Up Recommendations  CIR;Supervision/Assistance - 24 hour     Equipment Recommendations  Rolling walker with 5" wheels    Recommendations for Other Services Rehab consult     Precautions / Restrictions Precautions Precautions: Fall Precaution Comments:  impulsive  Restrictions Weight Bearing Restrictions: No    Mobility  Bed Mobility               General bed mobility comments: OOB on arrival  Transfers Overall transfer level: Needs assistance Equipment used: Rolling walker (2 wheeled) Transfers: Sit to/from Stand Sit to Stand: Min assist Stand pivot transfers: Min assist       General transfer comment: Worked on coming forward on standing, centering mass and use of UE's for soft/controlled sitting.  Pt's pivotal steps mildly ataxic, catching his L foot on his right foot several times.  Ambulation/Gait Ambulation/Gait assistance: Min assist Gait Distance (Feet): 110 Feet (x2 with standing rest against the wall.) Assistive device: Rolling walker (2 wheeled)     Gait velocity interpretation: 1.31 - 2.62 ft/sec, indicative of limited community ambulator General Gait Details: Step-through pattern; less  ataxic;Wider base of support.  Worked on heel toe pattern with narrowing BOS, standing upright, not looking at his feet..  Noticeably degraded gait quality with muscle fatigue.   Stairs             Wheelchair Mobility    Modified Rankin (Stroke Patients Only)       Balance Overall balance assessment: Needs assistance Sitting-balance support: Feet supported Sitting balance-Leahy Scale: Good     Standing balance support: Bilateral upper extremity supported Standing balance-Leahy Scale: Poor                              Cognition Arousal/Alertness: Awake/alert Behavior During Therapy: WFL for tasks assessed/performed Overall Cognitive Status: Impaired/Different from baseline Area of Impairment: Problem solving;Awareness                           Awareness: Intellectual Problem Solving: Slow processing;Requires verbal cues;Requires tactile cues General Comments: decreased awareness of gait deficits      Exercises      General Comments        Pertinent Vitals/Pain Pain Assessment: No/denies pain    Home Living Family/patient expects to be discharged to:: Private residence Living Arrangements: Spouse/significant other Available Help at Discharge: Family;Available 24 hours/day Type of Home: House Home Access: Stairs to enter Entrance Stairs-Rails: Left;Right;Can reach both Home Layout: Multi-level;Able to live on main level with bedroom/bathroom Home Equipment: Crutches      Prior Function Level of Independence: Needs assistance  Gait / Transfers Assistance Needed: started to use crutches ~2 weeks ago.  Prior to that was ambulating with 1  crutch due to pelvic met  ADL's / Homemaking Assistance Needed: modified independence     PT Goals (current goals can now be found in the care plan section) Acute Rehab PT Goals PT Goal Formulation: With patient/family Time For Goal Achievement: 06/04/20 Potential to Achieve Goals: Good Progress  towards PT goals: Progressing toward goals    Frequency    Min 4X/week      PT Plan Current plan remains appropriate    Co-evaluation              AM-PAC PT "6 Clicks" Mobility   Outcome Measure  Help needed turning from your back to your side while in a flat bed without using bedrails?: A Little Help needed moving from lying on your back to sitting on the side of a flat bed without using bedrails?: A Little Help needed moving to and from a bed to a chair (including a wheelchair)?: A Little Help needed standing up from a chair using your arms (e.g., wheelchair or bedside chair)?: A Little Help needed to walk in hospital room?: A Little Help needed climbing 3-5 steps with a railing? : A Lot 6 Click Score: 17    End of Session   Activity Tolerance: Patient tolerated treatment well Patient left: in chair;with call bell/phone within reach Nurse Communication: Mobility status PT Visit Diagnosis: Unsteadiness on feet (R26.81);Other abnormalities of gait and mobility (R26.89);Other symptoms and signs involving the nervous system (R29.898);Difficulty in walking, not elsewhere classified (R26.2)     Time: 1405-1440 PT Time Calculation (min) (ACUTE ONLY): 35 min  Charges:  $Gait Training: 8-22 mins $Therapeutic Activity: 8-22 mins                     05/24/2020  Ginger Carne., PT Acute Rehabilitation Services [phone]  (pager) [phone]  (office)   Tessie Fass Eliyohu Class 05/24/2020, 3:47 PM

## 2020-05-24 NOTE — Progress Notes (Signed)
Subjective: Patient reports no leg pain or numbness tingling or weakness.  No back pain when at rest.  Appropriate soreness when he moves in bed.  States his legs feel better than preop  Objective: Vital signs in last 24 hours: Temp:  [97.4 F (36.3 C)-98.3 F (36.8 C)] 97.9 F (36.6 C) (11/29 0834) Pulse Rate:  [52-80] 77 (11/29 0834) Resp:  [11-20] 20 (11/29 0834) BP: (90-160)/(56-90) 90/79 (11/29 0834) SpO2:  [97 %-100 %] 99 % (11/29 0834) Arterial Line BP: (151-162)/(66-74) 162/70 (11/28 1120)  Intake/Output from previous day: 11/28 0701 - 11/29 0700 In: 1570 [P.O.:120; I.V.:1200; IV Piggyback:250] Out: 1120 [Urine:700; Drains:120; Blood:300] Intake/Output this shift: No intake/output data recorded.  Neurologic: Grossly normal with great strength in his legs to in bed exam  Lab Results: Lab Results  Component Value Date   WBC 7.5 05/22/2020   HGB 11.4 (L) 05/22/2020   HCT 33.7 (L) 05/22/2020   MCV 90.6 05/22/2020   PLT 251 05/22/2020   Lab Results  Component Value Date   INR 1.2 05/20/2020   BMET Lab Results  Component Value Date   NA 138 05/21/2020   K 3.6 05/21/2020   CL 101 05/21/2020   CO2 24 05/21/2020   GLUCOSE 168 (H) 05/21/2020   BUN 18 05/21/2020   CREATININE 0.78 05/21/2020   CALCIUM 9.4 05/21/2020    Studies/Results: DG Thoracic Spine 1 View  Result Date: 05/23/2020 CLINICAL DATA:  Thoracic laminectomy. EXAM: OPERATIVE THORACIC SPINE VIEW(S) COMPARISON:  September 18, 2019 FINDINGS: Intraoperative fluoroscopic image of the thoracic spine demonstrates placement of marking devise the level of T11 vertebral body. IMPRESSION: Intraoperative fluoroscopic image of the thoracic spine demonstrates placement of marking devise the level of T11 vertebral body. Electronically Signed   By: Fidela Salisbury M.D.   On: 05/23/2020 11:13   DG C-Arm 1-60 Min  Result Date: 05/23/2020 CLINICAL DATA:  Thoracic laminectomy. EXAM: DG C-ARM 1-60 MIN FLUOROSCOPY TIME:   Fluoroscopy Time:  7.6 seconds Radiation Exposure Index (if provided by the fluoroscopic device): 2.78 mGy Number of Acquired Spot Images: 0 COMPARISON:  September 18, 2019 FINDINGS: Intraoperative fluoroscopic image of the thoracic spine demonstrates placement of marking devise at the level of T11 vertebral body. IMPRESSION: Intraoperative fluoroscopic image of the thoracic spine demonstrates placement of marking device at the level of T11 vertebral body. Electronically Signed   By: Fidela Salisbury M.D.   On: 05/23/2020 11:12    Assessment/Plan: Doing very well postop day 1.  Reached out to radiation oncology.  Mobilize today.  PT OT.  Continue Decadron.  Hold Eliquis.  Estimated body mass index is 26.11 kg/m as calculated from the following:   Height as of this encounter: 5\' 10"  (1.778 m).   Weight as of this encounter: 82.6 kg.    LOS: 4 days    Patrick [name] 05/24/2020, 9:18 AM

## 2020-05-24 NOTE — Progress Notes (Signed)
[facility] CONSULT PROGRESS NOTE    Progress Note  Patrick [name]  ZOX:[id number] DOB: 1932/02/01 DOA: 05/19/2020 PCP: Merryl Hacker, No     Brief Narrative:   Patrick [name] is an 86 y.o. male past medical history significant for metastatic prostate cancer diagnosed in 2016 previously treated with radiation therapy currently on chemo, essential hypertension, nonischemic cardiomyopathy with an EF of 40% presents after having a fall the day prior to admission in the ED he was found to have a T6 T9 mass with compression of the spinal cord neurosurgery was consulted recommended to hold Eliquis as day or think about surgical intervention on 05/23/2020 he also recommended started IV Decadron palliative care was also consulted and awaiting further recommendations.  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 Further management per surgery. Continue to hold Eliquis when to resume per neurosurgery.  Hypertensive urgency: Blood pressure is elevated today he is currently on Coreg and hydralazine.   It is to note that at home he was both on Entresto and lisinopril these were stopped he received lisinopril on 05/23/2020 we will not resume the Entresto at this point will check a basic metabolic panel while out 48 hours for the lisinopril to come out of his system. And resume the Entresto on 05/26/2020. In the meantime we will start him on Norvasc 2.5 mg.  Dehydration: Due to elevated BUN he was started on IV fluid, now resolved.  Next  Fall at home: Consulted PT OT.  Normocytic anemia: No signs of overt bleeding hemoglobin at baseline.  Persistent atrial fibrillation: With a chads Vascor greater than 4, rate controlled with Coreg holding Eliquis and heparin when to  resume anticoagulation per surgery.  Prostate cancer metastatic to multiple sites [facility]) Noted.   DVT prophylaxis: SCD's Family Communication:none Status is: Inpatient  Remains inpatient appropriate because:Hemodynamically unstable   Dispo: The patient is from: Home              Anticipated d/c is to: SNF              Anticipated d/c date is: > 3 days              Patient currently is not medically stable to d/c.  Per neurosurgery.      Code Status:     Code Status Orders  (From admission, onward)         Start     Ordered   05/20/20 1550  Full code  Continuous        05/20/20 1551        Code Status History    This patient has a current code status but no historical code status.   Advance Care Planning Activity    Advance Directive Documentation     Most Recent Value  Type of Advance Directive Living will, Healthcare Power of Attorney  Pre-existing out of facility DNR order (yellow form or pink MOST form) -  "MOST" Form in Place? -        IV Access:    Peripheral IV   Procedures and diagnostic studies:   DG Thoracic Spine 1 View  Result Date: 05/23/2020 CLINICAL DATA:  Thoracic laminectomy. EXAM: OPERATIVE THORACIC SPINE VIEW(S) COMPARISON:  September 18, 2019 FINDINGS: Intraoperative fluoroscopic image of the thoracic spine demonstrates  placement of marking devise the level of T11 vertebral body. IMPRESSION: Intraoperative fluoroscopic image of the thoracic spine demonstrates placement of marking devise the level of T11 vertebral body. Electronically Signed   By: Fidela Salisbury M.D.   On: 05/23/2020 11:13   DG C-Arm 1-60 Min  Result Date: 05/23/2020 CLINICAL DATA:  Thoracic laminectomy. EXAM: DG C-ARM 1-60 MIN FLUOROSCOPY TIME:  Fluoroscopy Time:  7.6 seconds Radiation Exposure Index (if provided by the fluoroscopic device): 2.78 mGy Number of Acquired Spot Images: 0 COMPARISON:  September 18, 2019 FINDINGS: Intraoperative fluoroscopic image of the  thoracic spine demonstrates placement of marking devise at the level of T11 vertebral body. IMPRESSION: Intraoperative fluoroscopic image of the thoracic spine demonstrates placement of marking device at the level of T11 vertebral body. Electronically Signed   By: Fidela Salisbury M.D.   On: 05/23/2020 11:12     Medical Consultants:    None.  Anti-Infectives:   none  Subjective:    Patrick [name] awake in a good mood this morning.  Objective:    Vitals:   05/23/20 2341 05/24/20 0000 05/24/20 0409 05/24/20 0409  BP:  (!) 106/56 (!) 160/79   Pulse:  (!) 52 69   Resp:  11 19   Temp: (!) 97.4 F (36.3 C)   (!) 97.5 F (36.4 C)  TempSrc:      SpO2:      Weight:      Height:       SpO2: 99 % O2 Flow Rate (L/min): 6 L/min   Intake/Output Summary (Last 24 hours) at 05/24/2020 0813 Last data filed at 05/24/2020 0000 Gross per 24 hour  Intake 1570 ml  Output 1120 ml  Net 450 ml   Filed Weights   05/19/20 2312  Weight: 82.6 kg    Exam: General exam: In no acute distress. Respiratory system: Good air movement and clear to auscultation. Cardiovascular system: S1 & S2 heard, RRR. No JVD. Gastrointestinal system: Abdomen is nondistended, soft and nontender.  Extremities: No pedal edema. Skin: No rashes, lesions or ulcers  Data Reviewed:    Labs: Basic Metabolic Panel: Recent Labs  Lab 05/20/20 0343 05/21/20 0201  NA 134* 138  K 3.8 3.6  CL 101 101  CO2 25 24  GLUCOSE 113* 168*  BUN 30* 18  CREATININE 0.90 0.78  CALCIUM 9.6 9.4  MG  --  2.0   GFR Estimated Creatinine Clearance: 68.4 mL/min (by C-G formula based on SCr of 0.78 mg/dL). Liver Function Tests: Recent Labs  Lab 05/21/20 0201  AST 26  ALT 17  ALKPHOS 68  BILITOT 0.8  PROT 7.1  ALBUMIN 3.7   No results for input(s): LIPASE, AMYLASE in the last 168 hours. No results for input(s): AMMONIA in the last 168 hours. Coagulation profile Recent Labs  Lab 05/20/20 0343  INR 1.2    COVID-19 Labs  No results for input(s): DDIMER, FERRITIN, LDH, CRP in the last 72 hours.  Lab Results  Component Value Date   [place] NEGATIVE 05/20/2020    CBC: Recent Labs  Lab 05/20/20 0343 05/21/20 0201 05/22/20 0217  WBC 5.6 3.5* 7.5  NEUTROABS 3.4  --   --   HGB 11.3* 12.7* 11.4*  HCT 34.5* 38.6* 33.7*  MCV 93.0 92.1 90.6  PLT 239 269 251   Cardiac Enzymes: No results for input(s): CKTOTAL, CKMB, CKMBINDEX, TROPONINI in the last 168 hours. BNP (last 3 results) No results for input(s): PROBNP in the last 8760 hours. CBG:  No results for input(s): GLUCAP in the last 168 hours. D-Dimer: No results for input(s): DDIMER in the last 72 hours. Hgb A1c: No results for input(s): HGBA1C in the last 72 hours. Lipid Profile: No results for input(s): CHOL, HDL, LDLCALC, TRIG, CHOLHDL, LDLDIRECT in the last 72 hours. Thyroid function studies: No results for input(s): TSH, T4TOTAL, T3FREE, THYROIDAB in the last 72 hours.  Invalid input(s): FREET3 Anemia work up: No results for input(s): VITAMINB12, FOLATE, FERRITIN, TIBC, IRON, RETICCTPCT in the last 72 hours. Sepsis Labs: Recent Labs  Lab 05/20/20 0343 05/21/20 0201 05/22/20 0217  WBC 5.6 3.5* 7.5   Microbiology Recent Results (from the past 240 hour(s))  Resp Panel by RT-PCR (Flu A&B, Covid) Nasopharyngeal Swab     Status: None   Collection Time: 05/20/20 10:04 AM   Specimen: Nasopharyngeal Swab; Nasopharyngeal(NP) swabs in vial transport medium  Result Value Ref Range Status   SARS Coronavirus 2 by RT PCR NEGATIVE NEGATIVE Final    Comment: (NOTE) SARS-CoV-2 target nucleic acids are NOT DETECTED.  The SARS-CoV-2 RNA is generally detectable in upper respiratory specimens during the acute phase of infection. The lowest concentration of SARS-CoV-2 viral copies this assay can detect is 138 copies/mL. A negative result does not preclude SARS-Cov-2 infection and should not be used as the sole basis for treatment  or other patient management decisions. A negative result may occur with  improper specimen collection/handling, submission of specimen other than nasopharyngeal swab, presence of viral mutation(s) within the areas targeted by this assay, and inadequate number of viral copies(<138 copies/mL). A negative result must be combined with clinical observations, patient history, and epidemiological information. The expected result is Negative.  Fact Sheet for Patients:  EntrepreneurPulse.com.au  Fact Sheet for Healthcare Providers:  IncredibleEmployment.be  This test is no t yet approved or cleared by the [place] FDA and  has been authorized for detection and/or diagnosis of SARS-CoV-2 by FDA under an Emergency Use Authorization (EUA). This EUA will remain  in effect (meaning this test can be used) for the duration of the COVID-19 declaration under Section 564(b)(1) of the Act, 21 U.S.C.section 360bbb-3(b)(1), unless the authorization is terminated  or revoked sooner.       Influenza A by PCR NEGATIVE NEGATIVE Final   Influenza B by PCR NEGATIVE NEGATIVE Final    Comment: (NOTE) The Xpert Xpress SARS-CoV-2/FLU/RSV plus assay is intended as an aid in the diagnosis of influenza from Nasopharyngeal swab specimens and should not be used as a sole basis for treatment. Nasal washings and aspirates are unacceptable for Xpert Xpress SARS-CoV-2/FLU/RSV testing.  Fact Sheet for Patients: EntrepreneurPulse.com.au  Fact Sheet for Healthcare Providers: IncredibleEmployment.be  This test is not yet approved or cleared by the [place] FDA and has been authorized for detection and/or diagnosis of SARS-CoV-2 by FDA under an Emergency Use Authorization (EUA). This EUA will remain in effect (meaning this test can be used) for the duration of the COVID-19 declaration under Section 564(b)(1) of the Act, 21 U.S.C. section  360bbb-3(b)(1), unless the authorization is terminated or revoked.  Performed at [facility], [place] [address] [place],  [address]   MRSA PCR Screening     Status: None   Collection Time: 05/20/20  3:47 PM   Specimen: Nasal Mucosa; Nasopharyngeal  Result Value Ref Range Status   MRSA by PCR NEGATIVE NEGATIVE Final    Comment:        The GeneXpert MRSA Assay (FDA approved for NASAL specimens only),  is one component of a comprehensive MRSA colonization surveillance program. It is not intended to diagnose MRSA infection nor to guide or monitor treatment for MRSA infections. Performed at [facility], [place] [address] [place], [place] [address]      Medications:   . carvedilol  6.25 mg Oral BID WC  . dexamethasone (DECADRON) injection  4 mg Intravenous Q6H   Or  . dexamethasone  4 mg Oral Q6H  . HYDROcodone-acetaminophen  1 tablet Oral Q6H  . lisinopril  20 mg Oral Daily  . senna  1 tablet Oral BID  . sodium chloride flush  3 mL Intravenous Q12H   Continuous Infusions: . sodium chloride    . 0.9 % NaCl with KCl 20 mEq / L 75 mL/hr at 05/23/20 1259  . methocarbamol (ROBAXIN) IV        LOS: 4 days   Charlynne Cousins  [facility]  05/24/2020, 8:13 AM

## 2020-05-25 DIAGNOSIS — I160 Hypertensive urgency: Secondary | ICD-10-CM | POA: Diagnosis not present

## 2020-05-25 DIAGNOSIS — G9529 Other cord compression: Secondary | ICD-10-CM | POA: Diagnosis not present

## 2020-05-25 DIAGNOSIS — D649 Anemia, unspecified: Secondary | ICD-10-CM | POA: Diagnosis not present

## 2020-05-25 DIAGNOSIS — Z7901 Long term (current) use of anticoagulants: Secondary | ICD-10-CM | POA: Diagnosis not present

## 2020-05-25 MED ORDER — POLYETHYLENE GLYCOL 3350 17 G PO PACK
17.0000 g | PACK | Freq: Two times a day (BID) | ORAL | Status: AC
  Administered 2020-05-25 (×2): 17 g via ORAL
  Filled 2020-05-25 (×3): qty 1

## 2020-05-25 NOTE — Progress Notes (Signed)
[facility] CONSULT PROGRESS NOTE    Progress Note  Patrick [name]  WLN:[id number] DOB: Nov 19, 1931 DOA: 05/19/2020 PCP: Merryl Hacker, No     Brief Narrative:   Patrick [name] is an 86 y.o. male past medical history significant for metastatic prostate cancer diagnosed in 2016 previously treated with radiation therapy currently on chemo, essential hypertension, nonischemic cardiomyopathy with an EF of 40% presents after having a fall the day prior to admission in the ED he was found to have a T6 T9 mass with compression of the spinal cord neurosurgery was consulted recommended to hold Eliquis as day or think about surgical intervention on 05/23/2020 he also recommended started IV Decadron palliative care was also consulted and awaiting further recommendations.  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 Further management per surgery. Continue to hold Eliquis when to resume per neurosurgery. Physical therapy evaluated the patient and recommended CIR versus 24-hour supervision. CIR has been consulted. Has not had a bowel movement in 4 days started on MiraLAX temporarily. We will continue to follow along with you.  Hypertensive urgency: Blood pressure is elevated today he is currently on Coreg, Norvasc and hydralazine.   We will not resume the Entresto at this point. Resume the Entresto on 05/26/2020. Blood pressure seems relatively well-controlled continue current regimen.  Dehydration: Due to elevated BUN he was started on IV fluid, now resolved.    Fall at home: PT recommended CIR versus any 4-hour supervision awaiting inpatient rehab evaluation.  Normocytic anemia: No signs of overt bleeding hemoglobin at baseline.  Persistent atrial  fibrillation: With a chads Vascor greater than 4, rate controlled with Coreg holding Eliquis and heparin when to resume anticoagulation per surgery.  Prostate cancer metastatic to multiple sites [facility]) Noted.   DVT prophylaxis: SCD's Family Communication:none Status is: Inpatient  Remains inpatient appropriate because:Hemodynamically unstable   Dispo: The patient is from: Home              Anticipated d/c is to: SNF              Anticipated d/c date is: > 3 days              Patient currently is not medically stable to d/c.  Per neurosurgery.      Code Status:     Code Status Orders  (From admission, onward)         Start     Ordered   05/20/20 1550  Full code  Continuous        05/20/20 1551        Code Status History    This patient has a current code status but no historical code status.   Advance Care Planning Activity    Advance Directive Documentation     Most Recent Value  Type of Advance Directive Living will, Healthcare Power of Attorney  Pre-existing out of facility DNR order (yellow form or pink MOST form) --  "MOST" Form in Place? --        IV Access:    Peripheral IV   Procedures and diagnostic studies:   DG Thoracic Spine 1 View  Result Date: 05/23/2020 CLINICAL DATA:  Thoracic laminectomy. EXAM: OPERATIVE THORACIC SPINE VIEW(S) COMPARISON:  September 18, 2019 FINDINGS: Intraoperative fluoroscopic image of the thoracic spine demonstrates  placement of marking devise the level of T11 vertebral body. IMPRESSION: Intraoperative fluoroscopic image of the thoracic spine demonstrates placement of marking devise the level of T11 vertebral body. Electronically Signed   By: Fidela Salisbury M.D.   On: 05/23/2020 11:13   DG C-Arm 1-60 Min  Result Date: 05/23/2020 CLINICAL DATA:  Thoracic laminectomy. EXAM: DG C-ARM 1-60 MIN FLUOROSCOPY TIME:  Fluoroscopy Time:  7.6 seconds Radiation Exposure Index (if provided by the fluoroscopic device): 2.78 mGy  Number of Acquired Spot Images: 0 COMPARISON:  September 18, 2019 FINDINGS: Intraoperative fluoroscopic image of the thoracic spine demonstrates placement of marking devise at the level of T11 vertebral body. IMPRESSION: Intraoperative fluoroscopic image of the thoracic spine demonstrates placement of marking device at the level of T11 vertebral body. Electronically Signed   By: Fidela Salisbury M.D.   On: 05/23/2020 11:12     Medical Consultants:    None.  Anti-Infectives:   none  Subjective:    Patrick [name] no new complaints has not had a bowel movement.  Objective:    Vitals:   05/24/20 2000 05/24/20 2339 05/25/20 0406 05/25/20 0754  BP: 124/65 (!) 141/73 140/78 (!) 162/84  Pulse: 66 (!) 51 61 61  Resp: 20   14  Temp: 98 F (36.7 C) 98 F (36.7 C) 98.3 F (36.8 C) 97.6 F (36.4 C)  TempSrc: Oral   Oral  SpO2: 96%   98%  Weight:      Height:       SpO2: 98 % O2 Flow Rate (L/min): 6 L/min   Intake/Output Summary (Last 24 hours) at 05/25/2020 0809 Last data filed at 05/25/2020 0600 Gross per 24 hour  Intake 1080 ml  Output 2295 ml  Net -1215 ml   Filed Weights   05/19/20 2312  Weight: 82.6 kg    Exam: General exam: In no acute distress. Respiratory system: Good air movement and clear to auscultation. Cardiovascular system: S1 & S2 heard, RRR. No JVD. Gastrointestinal system: Abdomen is nondistended, soft and nontender.  Extremities: No pedal edema. Skin: No rashes, lesions or ulcers  Data Reviewed:    Labs: Basic Metabolic Panel: Recent Labs  Lab 05/20/20 0343 05/20/20 0343 05/21/20 0201 05/24/20 0958  NA 134*  --  138 134*  K 3.8   < > 3.6 4.1  CL 101  --  101 101  CO2 25  --  24 26  GLUCOSE 113*  --  168* 140*  BUN 30*  --  18 26*  CREATININE 0.90  --  0.78 1.07  CALCIUM 9.6  --  9.4 8.7*  MG  --   --  2.0  --    < > = values in this interval not displayed.   GFR Estimated Creatinine Clearance: 51.2 mL/min (by C-G formula based on  SCr of 1.07 mg/dL). Liver Function Tests: Recent Labs  Lab 05/21/20 0201  AST 26  ALT 17  ALKPHOS 68  BILITOT 0.8  PROT 7.1  ALBUMIN 3.7   No results for input(s): LIPASE, AMYLASE in the last 168 hours. No results for input(s): AMMONIA in the last 168 hours. Coagulation profile Recent Labs  Lab 05/20/20 0343  INR 1.2   COVID-19 Labs  No results for input(s): DDIMER, FERRITIN, LDH, CRP in the last 72 hours.  Lab Results  Component Value Date   [place] NEGATIVE 05/20/2020    CBC: Recent Labs  Lab 05/20/20 0343 05/21/20 0201 05/22/20 0217  WBC 5.6 3.5* 7.5  NEUTROABS 3.4  --   --   HGB 11.3* 12.7* 11.4*  HCT 34.5* 38.6* 33.7*  MCV 93.0 92.1 90.6  PLT 239 269 251   Cardiac Enzymes: No results for input(s): CKTOTAL, CKMB, CKMBINDEX, TROPONINI in the last 168 hours. BNP (last 3 results) No results for input(s): PROBNP in the last 8760 hours. CBG: No results for input(s): GLUCAP in the last 168 hours. D-Dimer: No results for input(s): DDIMER in the last 72 hours. Hgb A1c: No results for input(s): HGBA1C in the last 72 hours. Lipid Profile: No results for input(s): CHOL, HDL, LDLCALC, TRIG, CHOLHDL, LDLDIRECT in the last 72 hours. Thyroid function studies: No results for input(s): TSH, T4TOTAL, T3FREE, THYROIDAB in the last 72 hours.  Invalid input(s): FREET3 Anemia work up: No results for input(s): VITAMINB12, FOLATE, FERRITIN, TIBC, IRON, RETICCTPCT in the last 72 hours. Sepsis Labs: Recent Labs  Lab 05/20/20 0343 05/21/20 0201 05/22/20 0217  WBC 5.6 3.5* 7.5   Microbiology Recent Results (from the past 240 hour(s))  Resp Panel by RT-PCR (Flu A&B, Covid) Nasopharyngeal Swab     Status: None   Collection Time: 05/20/20 10:04 AM   Specimen: Nasopharyngeal Swab; Nasopharyngeal(NP) swabs in vial transport medium  Result Value Ref Range Status   SARS Coronavirus 2 by RT PCR NEGATIVE NEGATIVE Final    Comment: (NOTE) SARS-CoV-2 target nucleic acids  are NOT DETECTED.  The SARS-CoV-2 RNA is generally detectable in upper respiratory specimens during the acute phase of infection. The lowest concentration of SARS-CoV-2 viral copies this assay can detect is 138 copies/mL. A negative result does not preclude SARS-Cov-2 infection and should not be used as the sole basis for treatment or other patient management decisions. A negative result may occur with  improper specimen collection/handling, submission of specimen other than nasopharyngeal swab, presence of viral mutation(s) within the areas targeted by this assay, and inadequate number of viral copies(<138 copies/mL). A negative result must be combined with clinical observations, patient history, and epidemiological information. The expected result is Negative.  Fact Sheet for Patients:  EntrepreneurPulse.com.au  Fact Sheet for Healthcare Providers:  IncredibleEmployment.be  This test is no t yet approved or cleared by the [place] FDA and  has been authorized for detection and/or diagnosis of SARS-CoV-2 by FDA under an Emergency Use Authorization (EUA). This EUA will remain  in effect (meaning this test can be used) for the duration of the COVID-19 declaration under Section 564(b)(1) of the Act, 21 U.S.C.section 360bbb-3(b)(1), unless the authorization is terminated  or revoked sooner.       Influenza A by PCR NEGATIVE NEGATIVE Final   Influenza B by PCR NEGATIVE NEGATIVE Final    Comment: (NOTE) The Xpert Xpress SARS-CoV-2/FLU/RSV plus assay is intended as an aid in the diagnosis of influenza from Nasopharyngeal swab specimens and should not be used as a sole basis for treatment. Nasal washings and aspirates are unacceptable for Xpert Xpress SARS-CoV-2/FLU/RSV testing.  Fact Sheet for Patients: EntrepreneurPulse.com.au  Fact Sheet for Healthcare Providers: IncredibleEmployment.be  This test is  not yet approved or cleared by the [place] FDA and has been authorized for detection and/or diagnosis of SARS-CoV-2 by FDA under an Emergency Use Authorization (EUA). This EUA will remain in effect (meaning this test can be used) for the duration of the COVID-19 declaration under Section 564(b)(1) of the Act, 21 U.S.C. section 360bbb-3(b)(1), unless the authorization is terminated or revoked.  Performed at [facility], [place] [address] [place], [place] [address]  MRSA PCR Screening     Status: None   Collection Time: 05/20/20  3:47 PM   Specimen: Nasal Mucosa; Nasopharyngeal  Result Value Ref Range Status   MRSA by PCR NEGATIVE NEGATIVE Final    Comment:        The GeneXpert MRSA Assay (FDA approved for NASAL specimens only), is one component of a comprehensive MRSA colonization surveillance program. It is not intended to diagnose MRSA infection nor to guide or monitor treatment for MRSA infections. Performed at [facility], [place] [address], [place], [place] [address]      Medications:   . amLODipine  2.5 mg Oral Daily  . carvedilol  6.25 mg Oral BID WC  . dexamethasone (DECADRON) injection  4 mg Intravenous Q6H   Or  . dexamethasone  4 mg Oral Q6H  . HYDROcodone-acetaminophen  1 tablet Oral Q6H  . senna  1 tablet Oral BID  . sodium chloride flush  3 mL Intravenous Q12H   Continuous Infusions: . sodium chloride    . 0.9 % NaCl with KCl 20 mEq / L Stopped (05/24/20 1600)  . methocarbamol (ROBAXIN) IV        LOS: 5 days   Charlynne Cousins  [facility]  05/25/2020, 8:09 AM

## 2020-05-25 NOTE — Progress Notes (Signed)
Subjective: Patient reports some back soreness with activity.  Denies leg pain or numbness tingling or weakness.  He is walking better than before surgery.  Objective: Vital signs in last 24 hours: Temp:  [97.6 F (36.4 C)-98.3 F (36.8 C)] 97.7 F (36.5 C) (11/30 1207) Pulse Rate:  [51-80] 80 (11/30 1207) Resp:  [14-20] 15 (11/30 1207) BP: (124-162)/(65-84) 162/67 (11/30 1207) SpO2:  [96 %-99 %] 99 % (11/30 1207)  Intake/Output from previous day: 11/29 0701 - 11/30 0700 In: 1080 [P.O.:1080] Out: 2295 [Urine:2100; Drains:195] Intake/Output this shift: Total I/O In: 300 [P.O.:300] Out: 675 [Urine:675]  Neurologic: Grossly normal  Lab Results: Lab Results  Component Value Date   WBC 7.5 05/22/2020   HGB 11.4 (L) 05/22/2020   HCT 33.7 (L) 05/22/2020   MCV 90.6 05/22/2020   PLT 251 05/22/2020   Lab Results  Component Value Date   INR 1.2 05/20/2020   BMET Lab Results  Component Value Date   NA 134 (L) 05/24/2020   K 4.1 05/24/2020   CL 101 05/24/2020   CO2 26 05/24/2020   GLUCOSE 140 (H) 05/24/2020   BUN 26 (H) 05/24/2020   CREATININE 1.07 05/24/2020   CALCIUM 8.7 (L) 05/24/2020    Studies/Results: No results found.  Assessment/Plan: Overall I think he is doing well and progressing nicely.  I appreciate oncology's input.  Awaiting CIR decision.  Continue physical and Occupational Therapy.  Estimated body mass index is 26.11 kg/m as calculated from the following:   Height as of this encounter: 5\' 10"  (1.778 m).   Weight as of this encounter: 82.6 kg.    LOS: 5 days    Patrick [name] 05/25/2020, 12:52 PM

## 2020-05-25 NOTE — Care Management Important Message (Signed)
Important Message  Patient Details  Name: Patrick [name] MRN: [id number] Date of Birth: [birth year]   Medicare Important Message Given:  Yes     Alyn Jurney [address] 05/25/2020, 2:59 PM

## 2020-05-25 NOTE — Evaluation (Signed)
Physical Therapy Evaluation Patient Details Name: Patrick [name] MRN: [id number] DOB: 03/16/1932 Today's Date: 05/25/2020   History of Present Illness  This 86 y.o. male admitted after sustaining a fall.  Spinal cord compression relieved s/p thoracic laminectomy with medial fasciotomy and foraminotomy T6-7, T8 and removal of tumor on 11/28. PMHx includes: metastatic prostate CA with mets of the pelvis, A-Fib, Rt ulnar neuropathy, nonischemic cardiomyopathy, moderate aortic stenosis, LBBB, HTN, chronic systolic dysfunction.    Clinical Impression  Pt very anxious regarding "i'm leaving for rehab today." Pt reports not knowing where he is going and how long he will be there. Pt states "so I can leave tomorrow after rehab". Pt re-educated on roll of CIR, the rehab schedule and 3 hour therapy expectation, and what determines when he safe to d/c home. Pt with improved ambulation today with shoes on. Pt continues to have ataxic gait pattern requiring min/modA and use of RW for safe amb. Continue to recommend CIR. Acute PT to cont to follow.    Follow Up Recommendations CIR;Supervision/Assistance - 24 hour    Equipment Recommendations  Rolling walker with 5" wheels    Recommendations for Other Services Rehab consult     Precautions / Restrictions Precautions Precautions: Fall Precaution Comments: imp Restrictions Weight Bearing Restrictions: No      Mobility  Bed Mobility               General bed mobility comments: OOB in chair upon arrival    Transfers Overall transfer level: Needs assistance Equipment used: Rolling walker (2 wheeled) Transfers: Sit to/from Stand Sit to Stand: Min assist         General transfer comment: minA to power up and steady during transition of hands from chair to RW  Ambulation/Gait Ambulation/Gait assistance: Min assist Gait Distance (Feet): 120 Feet Assistive device: Rolling walker (2 wheeled) Gait Pattern/deviations: Step-through  pattern;Ataxic;Wide base of support Gait velocity: slow Gait velocity interpretation: <1.31 ft/sec, indicative of household ambulator General Gait Details: donned shoes today, pt reports feeling more supported. Pt initially with very unco-ordinated gait stepping past the front of walker, different step lengths L vs R, very unsteady. pt given cues to take smaller steps and minA provided to control pushing of walker forward, pt with improved sequential gait pattern and was able to clear bilat feet with occasion L toe catching t/o session due to fatigue. Pt with increased UE support/dependence on RW. pt with 2 episodes of LOB during turning requiring min/modA to prevent fall  Stairs            Wheelchair Mobility    Modified Rankin (Stroke Patients Only)       Balance Overall balance assessment: Needs assistance Sitting-balance support: Feet supported Sitting balance-Leahy Scale: Good     Standing balance support: Bilateral upper extremity supported Standing balance-Leahy Scale: Poor Standing balance comment: requires UE support                              Pertinent Vitals/Pain Pain Assessment: No/denies pain    Home Living                        Prior Function                 Hand Dominance        Extremity/Trunk Assessment  Communication      Cognition Arousal/Alertness: Awake/alert Behavior During Therapy: Anxious (about going to rehab and calling family) Overall Cognitive Status: Impaired/Different from baseline Area of Impairment: Memory;Problem solving;Safety/judgement                   Current Attention Level: Selective Memory: Decreased short-term memory Following Commands: Follows multi-step commands inconsistently;Follows multi-step commands with increased time;Follows one step commands consistently Safety/Judgement: Decreased awareness of safety;Decreased awareness of deficits   Problem Solving:  Difficulty sequencing;Requires verbal cues;Requires tactile cues General Comments: pt very anxious and confused about going to rehab and when family can come and why they can't start rehab today. Pt educated pt that there are no orders yet and if he is going to CIR pt educated on what to expect.       General Comments General comments (skin integrity, edema, etc.): Pt VSS on RA    Exercises Other Exercises Other Exercises: worked on [place] in sitting but specifically placing feet on 'Xs" made of tape on the floor, pt with improved co-ordination with practice when going slow, with increased speed pt unable to hit "X"   Assessment/Plan    PT Assessment    PT Problem List         PT Treatment Interventions      PT Goals (Current goals can be found in the Care Plan section)       Frequency Min 4X/week   Barriers to discharge        Co-evaluation               AM-PAC PT "6 Clicks" Mobility  Outcome Measure Help needed turning from your back to your side while in a flat bed without using bedrails?: A Little Help needed moving from lying on your back to sitting on the side of a flat bed without using bedrails?: A Little Help needed moving to and from a bed to a chair (including a wheelchair)?: A Little Help needed standing up from a chair using your arms (e.g., wheelchair or bedside chair)?: A Little Help needed to walk in hospital room?: A Little Help needed climbing 3-5 steps with a railing? : A Lot 6 Click Score: 17    End of Session Equipment Utilized During Treatment: Gait belt Activity Tolerance: Patient tolerated treatment well Patient left: in chair;with call bell/phone within reach Nurse Communication: Mobility status PT Visit Diagnosis: Unsteadiness on feet (R26.81);Other abnormalities of gait and mobility (R26.89);Other symptoms and signs involving the nervous system (R29.898);Difficulty in walking, not elsewhere classified (R26.2)    Time: 1020-1053 PT  Time Calculation (min) (ACUTE ONLY): 33 min   Charges:     PT Treatments $Gait Training: 8-22 mins $Therapeutic Exercise: 8-22 mins        Patrick [name], PT, DPT Acute Rehabilitation Services Pager #: [phone] Office #: [phone]   Berline Lopes 05/25/2020, 1:43 PM
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
## 2020-05-25 NOTE — Progress Notes (Signed)
Patient has done well today. Sitting on the side of the bed eating dinner. No distress noted at this time. Will continue to monitor.

## 2020-05-25 NOTE — Progress Notes (Signed)
Inpatient Rehab Admissions Coordinator:   Met with patient at bedside to discuss potential CIR admission. Pt. Stated interest. Will pursue for potential admit this week, pending bed availability and insurance auth.   Nema Oatley, MS, CCC-SLP Rehab Admissions Coordinator  [phone] (celll) [phone] (office)   

## 2020-05-26 DIAGNOSIS — G9529 Other cord compression: Secondary | ICD-10-CM | POA: Diagnosis not present

## 2020-05-26 DIAGNOSIS — D492 Neoplasm of unspecified behavior of bone, soft tissue, and skin: Secondary | ICD-10-CM

## 2020-05-26 DIAGNOSIS — I160 Hypertensive urgency: Secondary | ICD-10-CM | POA: Diagnosis not present

## 2020-05-26 DIAGNOSIS — Z7901 Long term (current) use of anticoagulants: Secondary | ICD-10-CM | POA: Diagnosis not present

## 2020-05-26 DIAGNOSIS — D649 Anemia, unspecified: Secondary | ICD-10-CM | POA: Diagnosis not present

## 2020-05-26 MED ORDER — SACUBITRIL-VALSARTAN 49-51 MG PO TABS
1.0000 | ORAL_TABLET | Freq: Two times a day (BID) | ORAL | Status: DC
  Administered 2020-05-27 – 2020-05-28 (×4): 1 via ORAL
  Filled 2020-05-26 (×4): qty 1

## 2020-05-26 NOTE — Progress Notes (Signed)
Doing great, no pain, no NTW, legs better than pre-op, moving well in bed.incision CDI, drain removed. Await rehab.
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
## 2020-05-26 NOTE — Progress Notes (Signed)
Patrick [name]  ZOX:[id number] DOB: 06/25/1932 DOA: 05/19/2020 PCP: Pcp, No    Brief Narrative:  Mr. 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 Now sp thoracic laminectomy with fasciotomy and foraminotomy at T6-T7-T8 with removal of dorsal epidural tumor.   86 year old male with past medical history of metastatic prostate cancer, diagnosed in 2016, hypertension and nonischemic cardiomyopathy who presented with mechanical fall.  At home patient was about to take a shower, while taking off his clothes he fell and hit the back of his head against a cabinet.  Over the last several days he did complain of back pain, treated as an outpatient with tramadol.  He has been getting radiation for metastatic prostate cancer.  On his initial physical examination his blood pressure was 162/80, heart rate 77, respiratory rate 18, oxygen saturation 98%, lungs were clear to auscultation bilaterally, heart S1-S2, present rhythmic, soft abdomen, no lower extremity edema.  Neurologically nonfocal.  Head CT with mild cerebral atrophy and microvascular disease.    Thoracic MRI with epidural soft tissue metastasis at the T6-T9 levels, extending into the pre-/paravertebral space with enhancement of the left greater than right proximal ribs. Moderate to severe spinal canal narrowing at the T6-T9 level secondary to soft tissue encasement. Osseous metastatic involvement of the T6-T8 vertebral bodies without discrete pathologic fracture  Lumbar spine MRI.  Left sacral ala osseous metastasis 5.0 x 4.6 cm.  Cervical spine MRI anterior C4 vertebral study hyperintense signal concerning for osseous metastasis.  Patient underwent thoracic laminectomy with medial fasciotomy and foraminotomy T6 78-T8 for removal of dorsal epidural tumor.  Pain and mobility have been improving. Good candidate for  inpatient rehab.   Today drain was removed.   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 Spinal cord compression due to malignant neoplasm metastatic to the spine/ metastatic prostate cancer. Improved mobility and pain, plan for possible transfer to CIR. Drain has been removed today.   Continue dexamethasone per surgery recommendations.  Pain control with hydrocodone, methocarbamol, morphine and tramadol.   2. Hypertensive urgency/ non ischemic cardiomyopathy . Improved blood pressure control, urgency has resolved, continue with carvedilol, amlodipine and hydralazine.  Blood pressure 163/84 today, will resume Entresto. Patient with compensated heart failure, systolic dysfunction.   3. Dehydration. Clinically resolved, patient off IV fluids.   4. Normocytic anemia. Stable cell count with Hgb at 11.4 and Hct 33,8  5. Persistent chronic atrial fibrillation. Rate control with carvedilol. For now holding on apixaban.  Will check with neurosurgery team when will be safe to resume apixan for anticoagulation.     Status is: Inpatient  Remains inpatient appropriate because:Inpatient level of care appropriate due to severity of illness   Dispo: The patient is from: Home              Anticipated d/c is to: CIR              Anticipated d/c date is: 1 day              Patient currently is medically stable to d/c.    DVT prophylaxis:  Enoxaparin   Code Status:   full  Family Communication:  No family at the bedside        Subjective: Patient is feeling well, his drain has been removed, no significant back pain, no nausea or vomiting.   Objective: Vitals:   05/25/20 2029 05/25/20 2325 05/26/20  0429 05/26/20 0855  BP: (!) 150/76 (!) 175/97 (!) 172/81 137/85  Pulse: (!) 54 (!) 52 (!) 49 69  Resp:    13  Temp: 97.6 F (36.4 C) 97.7 F (36.5 C) 97.6 F (36.4 C) 97.9 F (36.6 C)  TempSrc:    Oral  SpO2:    99%  Weight:      Height:        Intake/Output Summary (Last 24 hours) at 05/26/2020 1010 Last data filed at 05/26/2020 0600 Gross per 24 hour  Intake 663 ml  Output 1596 ml  Net -933 ml   Filed Weights   05/19/20 2312  Weight: 82.6 kg    Examination:   General: Not in pain or dyspnea, deconditioned  Neurology: Awake and alert, non focal  E ENT: mild pallor, no icterus, oral mucosa moist Cardiovascular: No JVD. S1-S2 present, rhythmic, no gallops, rubs, or murmurs. No lower extremity edema. Pulmonary: positive breath sounds bilaterally,with no wheezing, rhonchi or rales. Gastrointestinal. Abdomen soft and non tender Skin. No rashes Musculoskeletal: no joint deformities     Data Reviewed: I have personally reviewed following labs and imaging studies  CBC: Recent Labs  Lab 05/20/20 0343 05/21/20 0201 05/22/20 0217  WBC 5.6 3.5* 7.5  NEUTROABS 3.4  --   --   HGB 11.3* 12.7* 11.4*  HCT 34.5* 38.6* 33.7*  MCV 93.0 92.1 90.6  PLT 239 269 992   Basic Metabolic Panel: Recent Labs  Lab 05/20/20 0343 05/21/20 0201 05/24/20 0958  NA 134* 138 134*  K 3.8 3.6 4.1  CL 101 101 101  CO2 25 24 26   GLUCOSE 113* 168* 140*  BUN 30* 18 26*  CREATININE 0.90 0.78 1.07  CALCIUM 9.6 9.4 8.7*  MG  --  2.0  --    GFR: Estimated Creatinine Clearance: 51.2 mL/min (by C-G formula based on SCr of 1.07 mg/dL). Liver Function Tests: Recent Labs  Lab 05/21/20 0201  AST 26  ALT 17  ALKPHOS 68  BILITOT 0.8  PROT 7.1  ALBUMIN 3.7   No results for input(s): LIPASE, AMYLASE in the last 168 hours. No results for input(s): AMMONIA in the last 168 hours. Coagulation Profile: Recent Labs  Lab 05/20/20 0343  INR 1.2   Cardiac Enzymes: No results for input(s):  CKTOTAL, CKMB, CKMBINDEX, TROPONINI in the last 168 hours. BNP (last 3 results) No results for input(s): PROBNP in the last 8760 hours. HbA1C: No results for input(s): HGBA1C in the last 72 hours. CBG: No results for input(s): GLUCAP in the last 168 hours. Lipid Profile: No results for input(s): CHOL, HDL, LDLCALC, TRIG, CHOLHDL, LDLDIRECT in the last 72 hours. Thyroid Function Tests: No results for input(s): TSH, T4TOTAL, FREET4, T3FREE, THYROIDAB in the last 72 hours. Anemia Panel: No results for input(s): VITAMINB12, FOLATE, FERRITIN, TIBC, IRON, RETICCTPCT in the last 72 hours.    Radiology Studies: I have reviewed all of the imaging during this hospital visit personally     Scheduled Meds: . amLODipine  2.5 mg Oral Daily  . carvedilol  6.25 mg Oral BID WC  . dexamethasone (DECADRON) injection  4 mg Intravenous Q6H   Or  . dexamethasone  4 mg Oral Q6H  . HYDROcodone-acetaminophen  1 tablet Oral Q6H  . polyethylene glycol  17 g Oral BID  . senna  1 tablet Oral BID  . sodium chloride flush  3 mL Intravenous Q12H   Continuous Infusions: . sodium chloride    . 0.9 % NaCl with KCl 20 mEq / L Stopped (05/24/20 1600)  . methocarbamol (ROBAXIN) IV       LOS: 6 days        Shannette Tabares Gerome Apley, MD
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
## 2020-05-26 NOTE — Progress Notes (Signed)
Physical Therapy Treatment Patient Details Name: Patrick [name] MRN: [id number] DOB: 01/19/1932 Today's Date: 05/26/2020    History of Present Illness This 86 y.o. male admitted after sustaining a fall.  Spinal cord compression relieved s/p thoracic laminectomy with medial fasciotomy and foraminotomy T6-7, T8 and removal of tumor on 11/28. PMHx includes: metastatic prostate CA with mets of the pelvis, A-Fib, Rt ulnar neuropathy, nonischemic cardiomyopathy, moderate aortic stenosis, LBBB, HTN, chronic systolic dysfunction.    PT Comments    Pt making progress toward goals.   With complexity of tasks, pt shows degradation of balance and coordination, today leading to need for mod assist ie turns vs straight line gait.  Emphasis on sit to stands/transfer technique, progression of gait stability, quality and stamina before degradation.    Follow Up Recommendations  CIR;Supervision/Assistance - 24 hour     Equipment Recommendations  Rolling walker with 5" wheels    Recommendations for Other Services       Precautions / Restrictions Precautions Precautions: Fall Precaution Comments: impulsivities continue    Mobility  Bed Mobility Overal bed mobility: Needs Assistance Bed Mobility: Supine to Sit     Supine to sit: Supervision        Transfers Overall transfer level: Needs assistance Equipment used: Rolling walker (2 wheeled) Transfers: Sit to/from Stand Sit to Stand: Min assist;Min guard Stand pivot transfers: Min assist       General transfer comment: demo/cues for safe technique with multiple trials of sit to stand.  Still overall very unsteady, needing at least 3 points of contact for safety  Ambulation/Gait Ambulation/Gait assistance: Min assist;Mod assist (Mod assist with turning in the RW to the R >L) Gait Distance (Feet): 120 Feet (x2) Assistive device: Rolling walker (2 wheeled) Gait Pattern/deviations: Step-through pattern;Ataxic;Wide base of support Gait  velocity: slow Gait velocity interpretation: <1.8 ft/sec, indicate of risk for recurrent falls General Gait Details: With shoes, gait still very uncoordinated initially and consistently during turns.  Noted improvement in quality/ heel toe pattern until fatigue and then sharp decline in stability.  Overall L notably more uncoordinated than R with increasing circumduction, IR and narrowed step width with swing through.   Stairs             Wheelchair Mobility    Modified Rankin (Stroke Patients Only)       Balance Overall balance assessment: Needs assistance   Sitting balance-Leahy Scale: Good     Standing balance support: Bilateral upper extremity supported Standing balance-Leahy Scale: Poor Standing balance comment: requires UE support                             Cognition Arousal/Alertness: Awake/alert Behavior During Therapy: WFL for tasks assessed/performed Overall Cognitive Status: Impaired/Different from baseline                     Current Attention Level: Selective (can't divide or switch his attention well)   Following Commands: Follows multi-step commands inconsistently;Follows multi-step commands with increased time;Follows one step commands consistently Safety/Judgement: Decreased awareness of safety;Decreased awareness of deficits Awareness: Intellectual;Emergent Problem Solving: Slow processing;Difficulty sequencing;Requires verbal cues        Exercises      General Comments        Pertinent Vitals/Pain      Home Living                      Prior Function  PT Goals (current goals can now be found in the care plan section) Acute Rehab PT Goals Patient Stated Goal: to get better  PT Goal Formulation: With patient Time For Goal Achievement: 06/04/20 Potential to Achieve Goals: Good Progress towards PT goals: Progressing toward goals    Frequency    Min 4X/week      PT Plan Current plan  remains appropriate    Co-evaluation              AM-PAC PT "6 Clicks" Mobility   Outcome Measure  Help needed turning from your back to your side while in a flat bed without using bedrails?: A Little Help needed moving from lying on your back to sitting on the side of a flat bed without using bedrails?: A Little Help needed moving to and from a bed to a chair (including a wheelchair)?: A Little Help needed standing up from a chair using your arms (e.g., wheelchair or bedside chair)?: A Little Help needed to walk in hospital room?: A Little Help needed climbing 3-5 steps with a railing? : A Lot 6 Click Score: 17    End of Session   Activity Tolerance: Patient tolerated treatment well Patient left: with call bell/phone within reach;in bed Nurse Communication: Mobility status PT Visit Diagnosis: Unsteadiness on feet (R26.81);Other abnormalities of gait and mobility (R26.89);Other symptoms and signs involving the nervous system (R29.898);Difficulty in walking, not elsewhere classified (R26.2)     Time: 9292-4462 PT Time Calculation (min) (ACUTE ONLY): 41 min  Charges:  $Gait Training: 8-22 mins $Therapeutic Activity: 23-37 mins                     05/26/2020  Ginger Carne., PT Acute Rehabilitation Services [phone]  (pager) [phone]  (office)   Tessie Fass Landrum [place] 05/26/2020, 5:42 PM

## 2020-05-27 DIAGNOSIS — I160 Hypertensive urgency: Secondary | ICD-10-CM | POA: Diagnosis not present

## 2020-05-27 DIAGNOSIS — Z7901 Long term (current) use of anticoagulants: Secondary | ICD-10-CM | POA: Diagnosis not present

## 2020-05-27 DIAGNOSIS — D649 Anemia, unspecified: Secondary | ICD-10-CM | POA: Diagnosis not present

## 2020-05-27 DIAGNOSIS — G9529 Other cord compression: Secondary | ICD-10-CM | POA: Diagnosis not present

## 2020-05-27 NOTE — Progress Notes (Signed)
Inpatient Rehab Admissions Coordinator:   Pt. Has elected to pursue SNF vs inpatient rehab, I will withdraw case for CIR and sign off.   Clemens Catholic, [place], [place] Admissions Coordinator  [phone] ([place]) [phone] (office)

## 2020-05-27 NOTE — Progress Notes (Signed)
Pt refused midnight and 0400 vitals, stated he wanted to sleep.

## 2020-05-27 NOTE — TOC Progression Note (Addendum)
Transition of Care (TOC) - Progression Note  Marvetta Gibbons RN,BSN Transitions of Care Unit 4NP (non trauma) - RN Case Manager See Treatment Team for direct Phone #   Patient Details  Name: Patrick [name] MRN: [id number] Date of Birth: Jun 30, 1931  Transition of Care [facility]) CM/SW Contact  Dahlia Client, Romeo Rabon, RN Phone Number: 05/27/2020, 12:50 PM  Clinical Narrative:    Asked to come speak with pt and wife at bedside to review rehab options- came to room to speak with pt and wife and answer questions about CIR vs SNF options- all questions were answered by pt and spouse including duration, insurance coverage, ect. Also clarified with pt and wife that should insurance approve for a INPT rehab stay then pt would go home from [facility] and not be able to go for a SNF stay for rehab after a INPT Rehab stay. More clarification given to them about the short duration and goals for INPT rehab vs longer duration with STSNF.  Pt and wife have decided at this time that pt would like to go to [facility] and not continue to pursue INPT rehab here at [facility]. First preference for SNF is [facility], explained that if [facility] has no beds will need to broaden search. Pt voiced understanding.  Have called L. Staley - AC with INPT rehab that pt no longer wants to pursue INPT rehab-  Pt is pending SNF placement and insurance auth for SNF at this time   Expected Discharge Plan: [place] Barriers to Discharge: Ship broker, Continued Medical Work up  [employer] and Services Expected Discharge Plan: [place] In-house Referral: Clinical Social Work     Living arrangements for the past 2 months: Single Family Home                                       Social Determinants of Health (SDOH) Interventions    Readmission Risk Interventions No flowsheet data found.

## 2020-05-27 NOTE — Progress Notes (Signed)
Inpatient Rehab Admissions Coordinator:   I do have have a CIR bed available today. I have not received a decision from [facility], but anticipate a decision today. I notified the patient an he stated understanding.   Clemens Catholic, [place], [place] Admissions Coordinator  [phone] ([place]) [phone] (office)

## 2020-05-27 NOTE — Plan of Care (Signed)
  Problem: Education: Goal: Knowledge of General Education information will improve Description Including pain rating scale, medication(s)/side effects and non-pharmacologic comfort measures Outcome: Progressing   

## 2020-05-27 NOTE — Progress Notes (Signed)
He is doing really well.  He has little to no pain.  He is walking with his walker with physical therapy but still needs assistance and feels like he would benefit from short stay in rehabilitation.  His incision is clean dry and intact.  He has good strength in his legs.

## 2020-05-27 NOTE — Progress Notes (Signed)
Inpatient Rehab Admissions Coordinator:   I received approval for CIR admit;however, Pt. Maintains that he wants to go to [facility] for rehab and still wishes to withdraw his case. I will not pursue for CIR admit and will withdraw his case.  Clemens Catholic, [place], [place] Admissions Coordinator  [phone] ([place]) [phone] (office)

## 2020-05-27 NOTE — Progress Notes (Signed)
Physical Therapy Treatment Patient Details Name: Patrick [name] MRN: [id number] DOB: 1932-05-26 Today's Date: 05/27/2020    History of Present Illness This 86 y.o. male admitted after sustaining a fall.  Spinal cord compression relieved s/p thoracic laminectomy with medial fasciotomy and foraminotomy T6-7, T8 and removal of tumor on 11/28. PMHx includes: metastatic prostate CA with mets of the pelvis, A-Fib, Rt ulnar neuropathy, nonischemic cardiomyopathy, moderate aortic stenosis, LBBB, HTN, chronic systolic dysfunction.    PT Comments    Pt making steady gain toward goals with emphasis on practicing technique to make mobility/activities safer.  Spent extended period of time helping pt/family make a decision of where pt would get the best benefit from rehab.    Follow Up Recommendations  CIR;Supervision/Assistance - 24 hour     Equipment Recommendations  Rolling walker with 5" wheels    Recommendations for Other Services Rehab consult     Precautions / Restrictions Precautions Precautions: Fall    Mobility  Bed Mobility Overal bed mobility: Needs Assistance Bed Mobility: Supine to Sit     Supine to sit: Supervision        Transfers Overall transfer level: Needs assistance Equipment used: Rolling walker (2 wheeled) Transfers: Sit to/from Stand Sit to Stand: Min assist         General transfer comment: Again demo's and cued for safe technique on a couple of sit to stand trial, before pt clearly fatigued.  Ambulation/Gait Ambulation/Gait assistance: Min assist;Mod assist Gait Distance (Feet): 120 Feet (x3) Assistive device: Rolling walker (2 wheeled) Gait Pattern/deviations: Step-through pattern;Ataxic;Wide base of support Gait velocity: slow Gait velocity interpretation: <1.8 ft/sec, indicate of risk for recurrent falls General Gait Details: Again today, with shoes, gait still very uncoordinated initially and consistently during turns.  Noted improvement in  quality/ heel toe pattern until fatigue and then sharp decline in stability.  Overall L notably more uncoordinated than R.   Stairs             Wheelchair Mobility    Modified Rankin (Stroke Patients Only)       Balance     Sitting balance-Leahy Scale: Good     Standing balance support: Bilateral upper extremity supported Standing balance-Leahy Scale: Poor Standing balance comment: requires UE support                             Cognition Arousal/Alertness: Awake/alert Behavior During Therapy: WFL for tasks assessed/performed Overall Cognitive Status: Impaired/Different from baseline                     Current Attention Level: Selective Memory: Decreased short-term memory Following Commands: Follows multi-step commands inconsistently;Follows multi-step commands with increased time;Follows one step commands consistently Safety/Judgement: Decreased awareness of safety;Decreased awareness of deficits Awareness: Intellectual;Emergent Problem Solving: Slow processing;Difficulty sequencing;Requires verbal cues        Exercises      General Comments        Pertinent Vitals/Pain      Home Living                      Prior Function            PT Goals (current goals can now be found in the care plan section) Acute Rehab PT Goals Patient Stated Goal: to get better  PT Goal Formulation: With patient Time For Goal Achievement: 06/04/20 Potential to Achieve Goals: Good Progress towards PT goals:  Progressing toward goals    Frequency    Min 4X/week      PT Plan Current plan remains appropriate    Co-evaluation              AM-PAC PT "6 Clicks" Mobility   Outcome Measure  Help needed turning from your back to your side while in a flat bed without using bedrails?: A Little Help needed moving from lying on your back to sitting on the side of a flat bed without using bedrails?: A Little Help needed moving to and  from a bed to a chair (including a wheelchair)?: A Little Help needed standing up from a chair using your arms (e.g., wheelchair or bedside chair)?: A Little Help needed to walk in hospital room?: A Little Help needed climbing 3-5 steps with a railing? : A Lot 6 Click Score: 17    End of Session   Activity Tolerance: Patient tolerated treatment well Patient left: with call bell/phone within reach;in bed Nurse Communication: Mobility status PT Visit Diagnosis: Unsteadiness on feet (R26.81);Other abnormalities of gait and mobility (R26.89);Other symptoms and signs involving the nervous system (R29.898);Difficulty in walking, not elsewhere classified (R26.2)     Time: 0940-7680 PT Time Calculation (min) (ACUTE ONLY): 44 min  Charges:  $Gait Training: 8-22 mins $Therapeutic Activity: 8-22 mins $Neuromuscular Re-education: 8-22 mins                     05/27/2020  Ginger Carne., PT Acute Rehabilitation Services [phone]  (pager) [phone]  (office)   Tessie Fass Firmin [place] 05/27/2020, 2:15 PM

## 2020-05-27 NOTE — Progress Notes (Signed)
PROGRESS NOTE    Patrick [name]  EZM:[id number] DOB: 01/08/1932 DOA: 05/19/2020 PCP: Pcp, No    Brief Narrative:  [name] was admitted to the hospital with the working diagnosis of spinal cord compression due to malignant neoplasm metastatic to the thoracic spine. Now sp thoracic laminectomy with fasciotomy and foraminotomy at T6-T7-T8 with removal of dorsal epidural tumor.   86 year old male with past medical history of metastatic prostate cancer, diagnosed in 2016, hypertension and nonischemic cardiomyopathy who presented with mechanical fall.  At home patient was about to take a shower, while taking off his clothes he fell and hit the back of his head against a cabinet.  Over the last several days he did complain of back pain, treated as an outpatient with tramadol.  He has been getting radiation for metastatic prostate cancer.  On his initial physical examination his blood pressure was 162/80, heart rate 77, respiratory rate 18, oxygen saturation 98%, lungs were clear to auscultation bilaterally, heart S1-S2, present rhythmic, soft abdomen, no lower extremity edema.  Neurologically nonfocal.  Head CT with mild cerebral atrophy and microvascular disease.    Thoracic MRI with epidural soft tissue metastasis at the T6-T9 levels, extending into the pre-/paravertebral space with enhancement of the left greater than right proximal ribs. Moderate to severe spinal canal narrowing at the T6-T9 level secondary to soft tissue encasement. Osseous metastatic involvement of the T6-T8 vertebral bodies without discrete pathologic fracture  Lumbar spine MRI.  Left sacral ala osseous metastasis 5.0 x 4.6 cm.  Cervical spine MRI anterior C4 vertebral study hyperintense signal concerning for osseous metastasis.  Patient underwent thoracic laminectomy with medial fasciotomy and foraminotomy T6 78-T8 for removal of dorsal epidural tumor.  Pain and mobility have been improving. Good candidate for  inpatient rehab.   12/01 drain was removed.   Patient has decided to continue recovery at SNF.   Need to resume apixaban when safe per neurosurgery, no further inpatient recommendations.    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 Spinal cord compression due to malignant neoplasm metastatic to the spine/ metastatic prostate cancer. Patient is feeling well, back pain and mobility continue to improve. He has decided to be transfer to SNF to continue with his physical therapy. .   On dexamethasone per neurosurgery recommendations.  Analgesia control with hydrocodone, methocarbamol, morphine and tramadol.   2. Hypertensive urgency/ non ischemic cardiomyopathy. His blood pressure with well controlled 146/88 mmHg for inpatient parameters.  Continue with entresto and carvedilol, per his home regimen.   3. Dehydration. Tolerating po well. Clinically resolved.   4. Normocytic anemia/ iron deficiency. hgb 11.4 and hct 33,7, follow as outpatient.  Continue home regimen of oral ferrous sulfate.   5. Persistent chronic atrial fibrillation. Continue rate control with carvedilol, patient will need to resume apixaban when safe per neurosurgery   No further medical recommendations, follow up with primary care in 7 days. Will sign off, please call if further questions.   Status is: Inpatient  Remains inpatient appropriate because:Unsafe d/c plan   Dispo: The patient is from: Home              Anticipated d/c is to: SNF  Anticipated d/c date is: 1 day              Patient currently is medically stable to d/c.    DVT prophylaxis: scd   Code Status:   dnr   Family Communication:  No  family at the bedside      Subjective: Patient is feeling well, no nausea or vomiting, his back pain and mobility are improving.  Objective: Vitals:   05/26/20 1624 05/26/20 2000 05/27/20 0739 05/27/20 1615  BP: (!) 177/81 130/79 134/80 (!) 146/88  Pulse: (!) 51 66 74 70  Resp: 18  16 18   Temp: 97.8 F (36.6 C) 98.2 F (36.8 C) 97.9 F (36.6 C) 98.1 F (36.7 C)  TempSrc:   Oral Oral  SpO2: 99%  99% 98%  Weight:      Height:        Intake/Output Summary (Last 24 hours) at 05/27/2020 1619 Last data filed at 05/27/2020 1230 Gross per 24 hour  Intake 480 ml  Output 1500 ml  Net -1020 ml   Filed Weights   05/19/20 2312  Weight: 82.6 kg    Examination:   General: Not in pain or dyspnea.  Neurology: Awake and alert, non focal  E ENT: no pallor, no icterus, oral mucosa moist Cardiovascular: No JVD. S1-S2 present, with no gallops, rubs, or murmurs. No lower extremity edema. Pulmonary: positive breath sounds bilaterally, adequate air movement, no wheezing, rhonchi or rales. Gastrointestinal. Abdomen soft and non tender Skin. No rashes Musculoskeletal: no joint deformities     Data Reviewed: I have personally reviewed following labs and imaging studies  CBC: Recent Labs  Lab 05/21/20 0201 05/22/20 0217  WBC 3.5* 7.5  HGB 12.7* 11.4*  HCT 38.6* 33.7*  MCV 92.1 90.6  PLT 269 235   Basic Metabolic Panel: Recent Labs  Lab 05/21/20 0201 05/24/20 0958  NA 138 134*  K 3.6 4.1  CL 101 101  CO2 24 26  GLUCOSE 168* 140*  BUN 18 26*  CREATININE 0.78 1.07  CALCIUM 9.4 8.7*  MG 2.0  --    GFR: Estimated Creatinine Clearance: 51.2 mL/min (by C-G formula based on SCr of 1.07 mg/dL). Liver Function Tests: Recent Labs  Lab 05/21/20 0201  AST 26  ALT 17  ALKPHOS 68  BILITOT 0.8  PROT 7.1  ALBUMIN 3.7   No results for input(s): LIPASE, AMYLASE in the last 168 hours. No results for input(s): AMMONIA in the last 168 hours. Coagulation Profile: No results  for input(s): INR, PROTIME in the last 168 hours. Cardiac Enzymes: No results for input(s): CKTOTAL, CKMB, CKMBINDEX, TROPONINI in the last 168 hours. BNP (last 3 results) No results for input(s): PROBNP in the last 8760 hours. HbA1C: No results for input(s): HGBA1C in the last 72 hours. CBG: No results for input(s): GLUCAP in the last 168 hours. Lipid Profile: No results for input(s): CHOL, HDL, LDLCALC, TRIG, CHOLHDL, LDLDIRECT in the last 72 hours. Thyroid Function Tests: No results for input(s): TSH, T4TOTAL, FREET4, T3FREE, THYROIDAB in the last 72 hours. Anemia Panel: No results for input(s): VITAMINB12, FOLATE, FERRITIN, TIBC, IRON, RETICCTPCT in the last 72 hours.    Radiology Studies: I have reviewed all of the imaging during this hospital visit personally     Scheduled Meds: . carvedilol  6.25 mg Oral BID WC  . dexamethasone (DECADRON) injection  4 mg Intravenous Q6H   Or  . dexamethasone  4 mg Oral Q6H  . HYDROcodone-acetaminophen  1 tablet Oral  Q6H  . sacubitril-valsartan  1 tablet Oral BID  . senna  1 tablet Oral BID   Continuous Infusions: . methocarbamol (ROBAXIN) IV       LOS: 7 days        Gennette Shadix Gerome Apley, MD

## 2020-05-27 NOTE — TOC Progression Note (Signed)
Transition of Care [facility]) - Progression Note    Patient Details  Name: Patrick [name] MRN: [id number] Date of Birth: [birth year]  [facility] CM/SW [place], [place] Phone Number: 05/27/2020, 12:50 PM  Clinical Narrative:     CSW informed by [facility] patient wants to pursue SNF placement. [facility] is his SNF preference. CSW contacted [facility]- CSW waiting on response.  Thurmond Butts, MSW, [place] Clinical Social Worker   Expected Discharge Plan: IP Rehab Facility Barriers to Discharge: Ship broker, Continued Medical Work up  [employer] and Services Expected Discharge Plan: [place] In-house Referral: Clinical Social Work     Living arrangements for the past 2 months: Single Family Home                                       Social Determinants of Health (SDOH) Interventions    Readmission Risk Interventions No flowsheet data found.

## 2020-05-28 LAB — SURGICAL PATHOLOGY

## 2020-05-28 LAB — SARS CORONAVIRUS 2 BY RT PCR (HOSPITAL ORDER, PERFORMED IN ~~LOC~~ HOSPITAL LAB): SARS Coronavirus 2: NEGATIVE

## 2020-05-28 MED ORDER — TRAMADOL HCL 50 MG PO TABS: 50.0000 mg | ORAL_TABLET | Freq: Four times a day (QID) | ORAL | 1 refills | Status: DC | PRN

## 2020-05-28 MED ORDER — APIXABAN 5 MG PO TABS
5.0000 mg | ORAL_TABLET | Freq: Two times a day (BID) | ORAL | Status: DC
  Administered 2020-05-28 (×2): 5 mg via ORAL
  Filled 2020-05-28 (×2): qty 1

## 2020-05-28 NOTE — TOC Transition Note (Signed)
Transition of Care [facility]) - CM/SW Discharge Note   Patient Details  Name: Patrick [name] MRN: [id number] Date of Birth: [birth year]  Transition of Care [facility]) CM/SW Contact:  Vinie Sill, [place] Phone Number: 05/28/2020, 2:00 PM   Clinical Narrative:     Patient will DC to: [facility] DC Date:05/28/2020 Family Notified:spouse and daughter in Systems developer AC:ZYSA   Per MD patient is ready for discharge. RN, patient, and facility notified of DC. Discharge Summary sent to facility. RN given number for [phone], Room 210. Ambulance transport requested for patient.   Clinical Social Worker signing off.  Thurmond Butts, MSW, [place] Clinical Social Worker    Final next level of care: Skilled Nursing Facility Barriers to Discharge: Barriers Resolved   Patient Goals and CMS Choice        Discharge Placement              Patient chooses bed at: [facility] Patient to be transferred to facility by: [place] Name of family member notified: spouse Patient and family notified of of transfer: 05/28/20  Discharge Plan and Services In-house Referral: Clinical Social Work                                   Social Determinants of Health (SDOH) Interventions     Readmission Risk Interventions No flowsheet data found.

## 2020-05-28 NOTE — Discharge Summary (Signed)
Physician Discharge Summary  Patient ID: Patrick [name] MRN: [id number] DOB/AGE: July 17, 1931 86 y.o.  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 MRI showed large epidural mass T6-T9. Waited 4 days off eliquis, and then he was taken to the operating room where the patient underwent T6-T9 laminectomy for tumor. The patient tolerated the procedure well and was taken to the recovery room and then to the stable in stable condition. The hospital course was routine. There were no complications. The wound remained clean dry and intact. Pt had appropriate back soreness. No complaints of leg pain or new N/T/W. The patient remained afebrile with stable vital signs, and tolerated a regular diet. The patient continued to increase activities, and pain was well controlled with oral pain medications. Oncology saw in consultation to arrange adjuvant care.  Consults: hematology/oncology and rehabilitation medicine  Significant Diagnostic Studies:  Results for orders placed or performed during the hospital encounter of 05/19/20  Resp Panel by RT-PCR (Flu A&B, Covid) Nasopharyngeal Swab   Specimen: Nasopharyngeal Swab; Nasopharyngeal(NP) swabs in vial transport medium  Result Value Ref Range   SARS Coronavirus 2 by RT PCR NEGATIVE NEGATIVE   Influenza A by PCR NEGATIVE NEGATIVE   Influenza B by PCR NEGATIVE NEGATIVE  MRSA PCR Screening   Specimen: Nasal Mucosa; Nasopharyngeal  Result Value Ref Range   MRSA by PCR NEGATIVE NEGATIVE  Urinalysis, Routine w reflex microscopic Urine, Clean Catch  Result Value Ref Range   Color, Urine YELLOW YELLOW   APPearance CLEAR CLEAR   Specific Gravity, Urine 1.014 1.005 - 1.030   pH 5.0 5.0 - 8.0   Glucose, UA NEGATIVE NEGATIVE mg/dL   Hgb urine  dipstick MODERATE (A) NEGATIVE   Bilirubin Urine NEGATIVE NEGATIVE   Ketones, ur NEGATIVE NEGATIVE mg/dL   Protein, ur NEGATIVE NEGATIVE mg/dL   Nitrite NEGATIVE NEGATIVE   Leukocytes,Ua NEGATIVE NEGATIVE   RBC / HPF 6-10 0 - 5 RBC/hpf   WBC, UA 0-5 0 - 5 WBC/hpf   Bacteria, UA RARE (A) NONE SEEN   Squamous Epithelial / LPF 0-5 0 - 5   Mucus PRESENT    Hyaline Casts, UA PRESENT   CBC with Differential/Platelet  Result Value Ref Range   WBC 5.6 4.0 - 10.5 K/uL   RBC 3.71 (L) 4.22 - 5.81 MIL/uL   Hemoglobin 11.3 (L) 13.0 - 17.0 g/dL   HCT 34.5 (L) 39 - 52 %   MCV 93.0 80.0 - 100.0 fL   MCH 30.5 26.0 - 34.0 pg   MCHC 32.8 30.0 - 36.0 g/dL   RDW 14.6 11.5 - 15.5 %   Platelets 239 150 - 400 K/uL   nRBC 0.0 0.0 - 0.2 %   Neutrophils Relative % 61 %   Neutro Abs 3.4 1.7 - 7.7 K/uL   Lymphocytes Relative 24 %   Lymphs Abs 1.3 0.7 - 4.0 K/uL   Monocytes Relative 13 %   Monocytes Absolute 0.7 0.1 - 1.0 K/uL   Eosinophils Relative 1 %   Eosinophils Absolute 0.1 0.0 - 0.5 K/uL   Basophils Relative 1 %   Basophils Absolute 0.0 0.0 - 0.1 K/uL   Immature Granulocytes 0 %   Abs Immature Granulocytes 0.01 0.00 - 0.07 K/uL  Basic metabolic panel  Result Value Ref Range   Sodium 134 (L) 135 -  145 mmol/L   Potassium 3.8 3.5 - 5.1 mmol/L   Chloride 101 98 - 111 mmol/L   CO2 25 22 - 32 mmol/L   Glucose, Bld 113 (H) 70 - 99 mg/dL   BUN 30 (H) 8 - 23 mg/dL   Creatinine, Ser 0.90 0.61 - 1.24 mg/dL   Calcium 9.6 8.9 - 10.3 mg/dL   GFR, Estimated >60 >60 mL/min   Anion gap 8 5 - 15  Protime-INR  Result Value Ref Range   Prothrombin Time 14.4 11.4 - 15.2 seconds   INR 1.2 0.8 - 1.2  APTT  Result Value Ref Range   aPTT 39 (H) 24 - 36 seconds  Comprehensive metabolic panel  Result Value Ref Range   Sodium 138 135 - 145 mmol/L   Potassium 3.6 3.5 - 5.1 mmol/L   Chloride 101 98 - 111 mmol/L   CO2 24 22 - 32 mmol/L   Glucose, Bld 168 (H) 70 - 99 mg/dL   BUN 18 8 - 23 mg/dL   Creatinine,  Ser 0.78 0.61 - 1.24 mg/dL   Calcium 9.4 8.9 - 10.3 mg/dL   Total Protein 7.1 6.5 - 8.1 g/dL   Albumin 3.7 3.5 - 5.0 g/dL   AST 26 15 - 41 U/L   ALT 17 0 - 44 U/L   Alkaline Phosphatase 68 38 - 126 U/L   Total Bilirubin 0.8 0.3 - 1.2 mg/dL   GFR, Estimated >60 >60 mL/min   Anion gap 13 5 - 15  Magnesium  Result Value Ref Range   Magnesium 2.0 1.7 - 2.4 mg/dL  Heparin level (unfractionated)  Result Value Ref Range   Heparin Unfractionated 1.54 (H) 0.30 - 0.70 IU/mL  CBC  Result Value Ref Range   WBC 3.5 (L) 4.0 - 10.5 K/uL   RBC 4.19 (L) 4.22 - 5.81 MIL/uL   Hemoglobin 12.7 (L) 13.0 - 17.0 g/dL   HCT 38.6 (L) 39 - 52 %   MCV 92.1 80.0 - 100.0 fL   MCH 30.3 26.0 - 34.0 pg   MCHC 32.9 30.0 - 36.0 g/dL   RDW 14.4 11.5 - 15.5 %   Platelets 269 150 - 400 K/uL   nRBC 0.0 0.0 - 0.2 %  APTT  Result Value Ref Range   aPTT 67 (H) 24 - 36 seconds  APTT  Result Value Ref Range   aPTT 72 (H) 24 - 36 seconds  Heparin level (unfractionated)  Result Value Ref Range   Heparin Unfractionated 1.04 (H) 0.30 - 0.70 IU/mL  CBC  Result Value Ref Range   WBC 7.5 4.0 - 10.5 K/uL   RBC 3.72 (L) 4.22 - 5.81 MIL/uL   Hemoglobin 11.4 (L) 13.0 - 17.0 g/dL   HCT 33.7 (L) 39 - 52 %   MCV 90.6 80.0 - 100.0 fL   MCH 30.6 26.0 - 34.0 pg   MCHC 33.8 30.0 - 36.0 g/dL   RDW 14.7 11.5 - 15.5 %   Platelets 251 150 - 400 K/uL   nRBC 0.0 0.0 - 0.2 %  APTT  Result Value Ref Range   aPTT 90 (H) 24 - 36 seconds  Basic metabolic panel  Result Value Ref Range   Sodium 134 (L) 135 - 145 mmol/L   Potassium 4.1 3.5 - 5.1 mmol/L   Chloride 101 98 - 111 mmol/L   CO2 26 22 - 32 mmol/L   Glucose, Bld 140 (H) 70 - 99 mg/dL   BUN 26 (H) 8 -  23 mg/dL   Creatinine, Ser 1.07 0.61 - 1.24 mg/dL   Calcium 8.7 (L) 8.9 - 10.3 mg/dL   GFR, Estimated >60 >60 mL/min   Anion gap 7 5 - 15  Type and screen [place]  Result Value Ref Range   ABO/RH(D) A POS    Antibody Screen NEG    Sample Expiration       05/26/2020,2359 Performed at [facility], [place] [address], [place], [place] [address]     DG Lumbar Spine Complete  Result Date: 05/19/2020 CLINICAL DATA:  Right hip and low back pain after a fall EXAM: LUMBAR SPINE - COMPLETE 4+ VIEW COMPARISON:  09/18/2019 FINDINGS: Mild lumbar scoliosis convex towards the right, unchanged. No anterior subluxation. Diffuse bone demineralization. Compression of the superior endplates at C62, L1, and L2 is unchanged since prior study, likely indicating osteoporosis. No new vertebral compression. Degenerative changes with narrowed interspaces and endplate hypertrophic change. Degenerative changes in the lumbar facet joints. No focal bone lesion or bone destruction. Aortic vascular calcifications. IMPRESSION: Mild scoliosis and degenerative changes. Diffuse bone demineralization. No acute compression. Electronically Signed   By: Lucienne Capers M.D.   On: 05/19/2020 23:52   CT HEAD WO CONTRAST  Result Date: 05/19/2020 CLINICAL DATA:  Status post fall. EXAM: CT HEAD WITHOUT CONTRAST TECHNIQUE: Contiguous axial images were obtained from the base of the skull through the vertex without intravenous contrast. COMPARISON:  None. FINDINGS: Brain: There is mild cerebral atrophy with widening of the extra-axial spaces and ventricular dilatation. There are areas of decreased attenuation within the white matter tracts of the supratentorial brain, consistent with microvascular disease changes. Vascular: No hyperdense vessel or unexpected calcification. Skull: Normal. Negative for fracture or focal lesion. Sinuses/Orbits: There is marked severity frontal, sphenoid, bilateral ethmoid and left maxillary sinus mucosal thickening. Mild to moderate severity right maxillary sinus mucosal thickening is also seen. Other: None. IMPRESSION: 1. Mild cerebral atrophy and microvascular disease changes of the supratentorial brain. 2. No acute intracranial abnormality. 3. Marked  severity pansinus disease. Electronically Signed   By: Virgina Norfolk M.D.   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 No intravenous contrast was administered. COMPARISON:  10/05/2011 cervical spine radiographs. FINDINGS: Alignment: Grade 1 C3-4 and C4-5 anterolisthesis. Grade 1 C7-T1 anterolisthesis. Vertebrae: Vertebral body heights are preserved. Diffuse bone marrow heterogeneity. Ill-defined STIR hyperintense signal involving the anterior C4 vertebral body. Cord: Normal signal and morphology. Posterior Fossa, vertebral arteries: Negative. Disc levels: Multilevel desiccation and disc space loss. Partial fusion at the C5-6 level. C2-3: No significant disc bulge. Uncovertebral and facet degenerative spurring. Patent spinal canal and neural foramen. C3-4: Disc osteophyte complex with superimposed central protrusion. Uncovertebral and facet hypertrophy. Patent spinal canal. Mild bilateral neural foraminal narrowing. C4-5: Disc osteophyte complex with right predominant uncovertebral and facet hypertrophy. Shallow central protrusion. Patent spinal canal and left neural foramen. Moderate to severe right neural foraminal narrowing. C5-6: Disc osteophyte complex partially effacing the ventral CSF containing spaces with uncovertebral and facet hypertrophy. Prominent ligamentum flavum. Mild spinal canal and moderate bilateral neural foraminal narrowing. C6-7: Disc osteophyte complex with uncovertebral and facet hypertrophy. Prominent ligamentum flavum. Mild spinal canal, mild left and moderate right neural foraminal narrowing. C7-T1: Minimal grade 1 anterolisthesis with uncovered posterior bulge. Patent spinal canal and  neural foramen. Paraspinal tissues: Negative. IMPRESSION: 1. Anterior C4  vertebral body STIR hyperintense signal is concerning for osseous metastases. No fracture. Consider postcontrast imaging for further evaluation. 2. Multilevel spondylosis as detailed above. 3. Mild C5-6 and C6-7 spinal canal narrowing. 4. Moderate to severe right C4-5, bilateral C5-6 and right C6-7 neural foraminal narrowing. Electronically Signed   By: Primitivo Gauze M.D.   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 No intravenous contrast was administered. COMPARISON:  Concurrent MRI thoracic spine. 05/19/2020 lumbar spine radiographs. FINDINGS: Segmentation:  Standard. Alignment: Minimal grade 1 L3-4 retrolisthesis. Straightening of lordosis. Vertebrae: Chronic L1 posttraumatic deformity. No retropulsion. Mild multilevel Modic type 2 endplate degenerative changes. Partially imaged left sacral ala osseous metastases measuring 5.0 x 4.6 cm (2:14). No discrete acute fracture. STIR hyperintense signal underlying the posterior inferior L1 endplate. Conus medullaris and cauda equina: Conus extends to the L1 level. Conus and cauda equina appear normal. Disc levels: Multilevel desiccation and disc space loss. Multilevel endplate degenerative spurring. L1-2: No significant disc bulge. Bilateral facet degenerative spurring. Patent spinal canal and neural foramen. L2-3: Mild disc bulge with superimposed right foraminal protrusion. Facet degenerative spurring. Patent spinal canal and neural foramen. L3-4: Mild disc bulge and bilateral facet degenerative spurring. Patent spinal canal. Mild bilateral neural foraminal narrowing. L4-5: Disc bulge with shallow right foraminal and central protrusions. Facet degenerative spurring. Patent spinal canal. Mild bilateral  neural foraminal narrowing. L5-S1: Minimal disc bulge and bilateral facet hypertrophy. Patent spinal canal and right neural foramen. Mild left neural foraminal narrowing. Paraspinal and other soft tissues: Bilateral renal cysts. IMPRESSION: 1. Partially imaged left sacral ala osseous metastases measuring 5.0 x 4.6 cm. Consider MRI pelvis with and without contrast for better evaluation. 2. STIR hyperintense signal underlying the dorsal inferior endplate of L1, degenerative versus small osseous metastasis. No discrete acute fracture. 3. Multilevel spondylosis as detailed above. Patent spinal canal. Multilevel mild neural foraminal narrowing. Electronically Signed   By: Primitivo Gauze M.D.   On: 05/20/2020 09:58   MR THORACIC SPINE W WO CONTRAST  Result Date: 05/20/2020 CLINICAL DATA:  Mid-back pain leg weakness, h/o prostate cancer to bone EXAM: MRI THORACIC WITHOUT AND WITH CONTRAST TECHNIQUE: Multiplanar and multiecho pulse sequences of the thoracic spine were obtained without and with intravenous contrast. CONTRAST:  7.28mL GADAVIST GADOBUTROL 1 MMOL/ML IV SOLN COMPARISON:  Concurrent MRI of the cervical and lumbar spine. 10/05/2011 thoracic spine radiographs. FINDINGS: MRI THORACIC SPINE FINDINGS Alignment:  Normal. Vertebrae: Chronic L1 posttraumatic deformity with mild height loss. Thoracic vertebral body heights are preserved. Abnormal STIR hyperintense bone marrow signal at the T6-T8 levels involving the posterior elements. No discrete fracture. Cord: Normal cord morphology. Mild T2 hyperintense signal at the T8 level (19:9) without focal enhancement. Paraspinal and other soft tissues: Discussed below. Disc levels: Enhancing posterior epidural soft tissue measuring 8.2 by 1.3 cm spanning the T6-T8 levels (19:8). The soft tissue demonstrates extension into the lateral epidural spaces with moderate to severe spinal canal narrowing. There is extension of abnormal enhancing soft tissue into the para and  prevertebral spaces at the T6-T9 levels. Abnormal enhancing soft tissue extends laterally to encase the left greater than right ribs at these levels. IMPRESSION: Epidural soft tissue metastases at the T6-T9 levels extending into the  pre/paravertebral space with encasement of the left greater than right proximal ribs. Moderate to severe spinal canal narrowing at the T6-9 levels secondary to soft tissue encasement. Mild T2 hyperintense cord signal at the T8 level may reflect edema. Osseous metastatic involvement of the T6-T8 vertebral bodies without discrete pathologic fracture. Electronically Signed   By: Primitivo Gauze M.D.   On: 05/20/2020 09:50   MR PELVIS W WO CONTRAST  Addendum Date: 05/21/2020   ADDENDUM REPORT: 05/21/2020 16:51 ADDENDUM: The original report was by Dr. Zetta Bills. The following addendum is by Dr. Van Clines: Original extensive imaging of the prostate consisting of many thousands of images reported previously by Dr. Jacalyn Lefevre. The patient was brought back for additional imaging using the bony pelvis protocol, and I was asked to addend Dr. Reymundo Poll dictation with my interpretation of these additional images. Osseous structures: Large osseous metastatic lesion encompassing much of the right iliac bone extending from about the level of the iliac crest down to involve the acetabulum, including the anterior and posterior walls as well as the upper quadrilateral plate. This has high T2 and intermediate to low precontrast T1 signal characteristics. Lower lumbar spondylosis and degenerative disc disease. A second dominant lesion is in the left sacrum, measuring 5.4 by 3.9 by 5.1 cm, primarily involving the S1 and S2 segments. This extends over to abut the SI joint. No other significant osseous metastatic lesions are identified. Musculotendinous: Low-level edema deep to the right iliacus muscle is present on image 11 of series 216, adjacent to the iliac bone metastatic lesion. This tracks  down along the iloipsoas. There is also low-level edema tracking in the right hip adductor musculature. Hamstring tendons intact. Other: Sigmoid colon diverticulosis. Urinary bladder unremarkable. Low-grade presacral edema. Electronically Signed   By: Van Clines M.D.   On: 05/21/2020 16:51   Addendum Date: 05/21/2020   ADDENDUM REPORT: 05/21/2020 09:38 ADDENDUM: Study was performed as a prostate evaluation and should have been performed as a musculoskeletal pelvis. Findings were discussed with the referring physician as outlined below. Findings were also relayed to staff at the site in preparation for repeat imaging. These results were called by telephone at the time of interpretation on 05/21/2020 at 8:25 is am to provider Dr. Shelba Flake, who verbally acknowledged these results. Electronically Signed   By: Zetta Bills M.D.   On: 05/21/2020 09:38   Result Date: 05/21/2020 CLINICAL DATA:  History of prostate cancer. EXAM: MRI PELVIS WITHOUT AND WITH CONTRAST TECHNIQUE: Multiplanar multisequence MR imaging of the pelvis was performed both before and after administration of intravenous contrast. CONTRAST:  8.54mL GADAVIST GADOBUTROL 1 MMOL/ML IV SOLN COMPARISON:  Chest abdomen and pelvis CT from March of 2021 FINDINGS: Urinary Tract: Urinary bladder is distended and trabeculated suggesting bladder outlet obstruction or neurogenic bladder. No distal ureteral dilation. Cysts of the LEFT kidney partially visualized. RIGHT renal cysts also partially visualized. Excreted contrast perhaps from the lumbar spine evaluation in the urinary bladder. Bowel: Visualized gastrointestinal tract without acute process. Bowel is incompletely imaged on the current exam is and study was not performed for bowel evaluation. Colonic diverticulosis of the sigmoid. Vascular/Lymphatic: Vascular structures are grossly patent.w incompletely evaluated. Limited field of view for postcontrast images per protocol, prostate protocol was  performed on the current study. Reproductive:  Prostate: Transitional zone: Nodular appearance of the transitional zone with suggestion of prior TURP. Diffuse low signal throughout the transitional zone. Concentrated gadolinium in the urinary bladder limits assessment on diffusion-weighted imaging. Peripheral zone: Diffuse low  signal throughout the peripheral zone with indistinct boundary of the prostate margin at the base of the LEFT prostate extending towards the atrophic LEFT seminal vesicles. RIGHT seminal vesicles also with atrophy. Other:  No ascites. Musculoskeletal: Large metastatic lesion in the LEFT sacrum (image 1, series 9) incompletely imaged, 5.1 x 3.8 cm with respect imaged portions. Infiltration of the RIGHT acetabulum also with metastatic disease and pubic root on the RIGHT as well, metastatic lesion extending into the RIGHT iliac bone involving much of the iliac bone in the entire roof of the RIGHT acetabulum. Sequences were not performed to assess for acute process such is pathologic fracture. Study was performed as a prostate protocol. No gross lesion seen in the lower spinal canal but this was evaluated on the recent MRI of the spine, please refer to that imaging study for further detail. Is IMPRESSION: 1. Diffuse low signal throughout the prostate. Suspicion for diffuse prostate cancer based on appearance of the prostate. PIRADS not assigned due to the presence of diffuse bony metastatic disease in the pelvis. Suspected extracapsular extension in the LEFT prostate base and potentially mid prostate. 2. RIGHT iliac, acetabular and pubic root involvement with involvement of the LEFT sacrum. Dedicated pelvic MRI for assessment of the bony pelvis may be helpful if there is clinical concern for pathologic fracture. No gross displacement of the bony pelvis is noted though study was not performed for fracture evaluation. 3. Urinary bladder is distended and trabeculated suggesting bladder outlet  obstruction or neurogenic bladder. No distal ureteral dilation. Is Electronically Signed: By: Zetta Bills M.D. On: 05/21/2020 08:19   DG Thoracic Spine 1 View  Result Date: 05/23/2020 CLINICAL DATA:  Thoracic laminectomy. EXAM: OPERATIVE THORACIC SPINE VIEW(S) COMPARISON:  September 18, 2019 FINDINGS: Intraoperative fluoroscopic image of the thoracic spine demonstrates placement of marking devise the level of T11 vertebral body. IMPRESSION: Intraoperative fluoroscopic image of the thoracic spine demonstrates placement of marking devise the level of T11 vertebral body. Electronically Signed   By: Fidela Salisbury M.D.   On: 05/23/2020 11:13   DG C-Arm 1-60 Min  Result Date: 05/23/2020 CLINICAL DATA:  Thoracic laminectomy. EXAM: DG C-ARM 1-60 MIN FLUOROSCOPY TIME:  Fluoroscopy Time:  7.6 seconds Radiation Exposure Index (if provided by the fluoroscopic device): 2.78 mGy Number of Acquired Spot Images: 0 COMPARISON:  September 18, 2019 FINDINGS: Intraoperative fluoroscopic image of the thoracic spine demonstrates placement of marking devise at the level of T11 vertebral body. IMPRESSION: Intraoperative fluoroscopic image of the thoracic spine demonstrates placement of marking device at the level of T11 vertebral body. Electronically Signed   By: Fidela Salisbury M.D.   On: 05/23/2020 11:12   DG Hip Unilat W or Wo Pelvis 2-3 Views Right  Result Date: 05/19/2020 CLINICAL DATA:  Right hip and low back pain after a fall this evening. EXAM: DG HIP (WITH OR WITHOUT PELVIS) 2-3V RIGHT COMPARISON:  None. FINDINGS: No evidence of acute fracture or dislocation involving the pelvis or right hip. SI joints and symphysis pubis are nondisplaced. Narrowing and sclerosis in the SI joints may represent partial ankylosis or degenerative change. No focal bone lesion or bone destruction. Soft tissues are unremarkable. IMPRESSION: No acute bony abnormalities. Electronically Signed   By: Lucienne Capers M.D.   On:  05/19/2020 23:50    Antibiotics:  Anti-infectives (From admission, onward)   Start     Dose/Rate Route Frequency Ordered Stop   05/23/20 1630  ceFAZolin (ANCEF) IVPB 2g/100 mL premix  2 g 200 mL/hr over 30 Minutes Intravenous Every 8 hours 05/23/20 1146 05/24/20 0044      Discharge Exam: Blood pressure 126/66, pulse 92, temperature 98.8 F (37.1 C), temperature source Oral, resp. rate 18, height 5\' 10"  (1.778 m), weight 82.6 kg, SpO2 94 %. Neurologic: Grossly normal incision CDI  Discharge Medications:   Allergies as of 05/28/2020      Reactions   Nitrofurantoin Rash   Other reaction(s): UNSPECIFIED Other reaction(s): UNSPECIFIED Other reaction(s): UNSPECIFIED Other reaction(s): UNSPECIFIED   Macrobid [nitrofurantoin Monohyd Macro]    fever      Medication List    TAKE these medications   CALCIUM PO Take 3 tablets by mouth 2 (two) times daily.   carvedilol 6.25 MG tablet Commonly known as: COREG Take 6.25 mg by mouth 2 (two) times daily.   Eliquis 5 MG Tabs tablet Generic drug: apixaban Take 5 mg by mouth 2 (two) times daily.   Entresto 49-51 MG Generic drug: sacubitril-valsartan Take 1 tablet by mouth at bedtime.   ferrous sulfate 325 (65 FE) MG tablet Take 1 tablet (325 mg total) by mouth daily with breakfast.   Multi-Vitamin tablet Take 1 tablet by mouth daily.   traMADol 50 MG tablet Commonly known as: ULTRAM Take 1 tablet (50 mg total) by mouth every 6 (six) hours as needed.            Durable Medical Equipment  (From admission, onward)         Start     Ordered   05/28/20 0750  DME Gilford Rile  Once       Question Answer Comment  Walker: With [place]   Patient needs a walker to treat with the following condition Gait instability      05/28/20 0749   05/23/20 1147  DME Walker rolling  Once       Question Answer Comment  Walker: With [place]   Patient needs a walker to treat with the following condition Gait instability       05/23/20 1146   05/23/20 1147  DME 3 n 1  Once        05/23/20 1146          Disposition: SNF   Final Dx: thoracic laminectomy for tumor  Discharge Instructions    Call MD for:  redness, tenderness, or signs of infection (pain, swelling, redness, odor or green/yellow discharge around incision site)   Complete by: As directed    Call MD for:  severe uncontrolled pain   Complete by: As directed    Call MD for:  temperature >100.4   Complete by: As directed    Diet - low sodium heart healthy   Complete by: As directed    Increase activity slowly   Complete by: As directed    No wound care   Complete by: As directed        Follow-up Information    Eustace Moore, MD. Schedule an appointment as soon as possible for a visit in 2 week(s).   Specialty: Neurosurgery Contact information: [address] [place] [address] [place] [address] [phone]                Signed: Eustace Moore 05/28/2020, 7:49 AM

## 2020-05-28 NOTE — Progress Notes (Signed)
PTAR at bedside, Pt belongings on stretcher with pt. AVS documentation and pt chart paperwork given to EMTs, IV taken out, pt alert and oriented x4 and moved to stretcher.

## 2020-05-28 NOTE — Care Management Important Message (Signed)
Important Message  Patient Details  Name: Patrick [name] MRN: [id number] Date of Birth: [birth year]   Medicare Important Message Given:  Yes   Due to staffing issue I called the patients room spoke with [name] his daughter in law made her aware of IM they have the original copy but advised that I could send final copy to home address.  [name] 05/28/2020, 12:56 PM

## 2020-05-28 NOTE — Progress Notes (Signed)
Report called to [place] at [facility], awaiting PTAR for transport.  Suetta Hoffmeister, Tivis Ringer, RN
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
## 2020-05-28 NOTE — TOC Progression Note (Signed)
Transition of Care [facility]) - Progression Note    Patient Details  Name: FAIRLEY COPHER MRN: [id number] Date of Birth: 08-11-1931  Transition of Care [facility]) CM/SW [place], [place] Phone Number: 05/28/2020, 10:08 AM  Clinical Narrative:     [place] remains pending- is being reviewed by medical director.   Thurmond Butts, MSW, LCSWA Clinical Social Worker   Expected Discharge Plan: IP Rehab Facility Barriers to Discharge: Ship broker, Continued Medical Work up  [employer] and Services Expected Discharge Plan: [place] In-house Referral: Clinical Social Work     Living arrangements for the past 2 months: Single Family Home Expected Discharge Date: 05/28/20                                     Social Determinants of Health (SDOH) Interventions    Readmission Risk Interventions No flowsheet data found.

## 2020-05-28 NOTE — Plan of Care (Signed)

## 2020-05-31 ENCOUNTER — Inpatient Hospital Stay: Payer: Medicare Other | Attending: Radiation Oncology

## 2020-05-31 DIAGNOSIS — C7951 Secondary malignant neoplasm of bone: Secondary | ICD-10-CM | POA: Insufficient documentation

## 2020-05-31 DIAGNOSIS — Z7901 Long term (current) use of anticoagulants: Secondary | ICD-10-CM | POA: Insufficient documentation

## 2020-05-31 DIAGNOSIS — I10 Essential (primary) hypertension: Secondary | ICD-10-CM | POA: Insufficient documentation

## 2020-05-31 DIAGNOSIS — C8333 Diffuse large B-cell lymphoma, intra-abdominal lymph nodes: Secondary | ICD-10-CM | POA: Insufficient documentation

## 2020-05-31 DIAGNOSIS — Z923 Personal history of irradiation: Secondary | ICD-10-CM | POA: Insufficient documentation

## 2020-05-31 DIAGNOSIS — Z87891 Personal history of nicotine dependence: Secondary | ICD-10-CM | POA: Insufficient documentation

## 2020-05-31 DIAGNOSIS — Z79899 Other long term (current) drug therapy: Secondary | ICD-10-CM | POA: Insufficient documentation

## 2020-05-31 DIAGNOSIS — I428 Other cardiomyopathies: Secondary | ICD-10-CM | POA: Insufficient documentation

## 2020-05-31 DIAGNOSIS — G4730 Sleep apnea, unspecified: Secondary | ICD-10-CM | POA: Insufficient documentation

## 2020-05-31 DIAGNOSIS — I447 Left bundle-branch block, unspecified: Secondary | ICD-10-CM | POA: Insufficient documentation

## 2020-05-31 DIAGNOSIS — I350 Nonrheumatic aortic (valve) stenosis: Secondary | ICD-10-CM | POA: Insufficient documentation

## 2020-05-31 DIAGNOSIS — C61 Malignant neoplasm of prostate: Secondary | ICD-10-CM | POA: Insufficient documentation

## 2020-06-02 ENCOUNTER — Encounter: Payer: Self-pay | Admitting: Radiation Oncology

## 2020-06-02 DIAGNOSIS — C8330 Diffuse large B-cell lymphoma, unspecified site: Secondary | ICD-10-CM | POA: Insufficient documentation

## 2020-06-04 ENCOUNTER — Encounter: Payer: Self-pay | Admitting: Radiation Oncology

## 2020-06-04 ENCOUNTER — Telehealth: Payer: Self-pay

## 2020-06-04 ENCOUNTER — Other Ambulatory Visit: Payer: Self-pay

## 2020-06-04 NOTE — Telephone Encounter (Signed)
Spoke with patient in regards to telephone appointment with Shona Simpson PA on 06/08/20 @ 8:30am. Patient verbalized understanding of appointment date and time. Reviewed meaningful use. TM

## 2020-06-07 ENCOUNTER — Telehealth: Payer: Self-pay | Admitting: Hematology and Oncology

## 2020-06-07 ENCOUNTER — Telehealth: Payer: Self-pay | Admitting: Radiation Therapy

## 2020-06-07 NOTE — Telephone Encounter (Signed)
Spoke with Olen Cordial, at [facility] in [place], to arrange transportation form their facility for [name]'s Med Onc appointment on Wed 12/15 with Dr. Lorenso Courier. I also spoke directly to the patient to let him know that his visit with Shona Simpson on 12/14 will be over the phone instead of in person. I explained that he will need to see the Medical Oncologist to determine what the preferred treatment method will be. Patrick [name] understood this and appreciated the call.   Mont Dutton R.T.(R)(T) Radiation Special Procedures Navigator

## 2020-06-07 NOTE — Telephone Encounter (Signed)
Received a call from Manuela Schwartz, neuro navigator, for DLBCL. Mr. Lampert has been scheduled to see Dr. Lorenso Courier on 12/15 at 2pm. Manuela Schwartz will provide the appt date and time to the pt.

## 2020-06-08 ENCOUNTER — Ambulatory Visit
Admission: RE | Admit: 2020-06-08 | Discharge: 2020-06-08 | Disposition: A | Discharge status: Home / Self Care | Payer: Medicare Other | Source: Ambulatory Visit | Attending: Radiation Oncology | Admitting: Radiation Oncology

## 2020-06-08 ENCOUNTER — Ambulatory Visit: Payer: Medicare Other | Admitting: Radiation Oncology

## 2020-06-08 ENCOUNTER — Other Ambulatory Visit: Payer: Self-pay

## 2020-06-08 DIAGNOSIS — C7951 Secondary malignant neoplasm of bone: Secondary | ICD-10-CM

## 2020-06-08 DIAGNOSIS — G9529 Other cord compression: Secondary | ICD-10-CM

## 2020-06-08 DIAGNOSIS — C8330 Diffuse large B-cell lymphoma, unspecified site: Secondary | ICD-10-CM
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
## 2020-06-09 ENCOUNTER — Encounter: Payer: Self-pay | Admitting: Radiation Oncology

## 2020-06-09 ENCOUNTER — Other Ambulatory Visit: Payer: Self-pay

## 2020-06-09 ENCOUNTER — Inpatient Hospital Stay: Payer: Medicare Other

## 2020-06-09 ENCOUNTER — Inpatient Hospital Stay: Payer: Medicare Other | Admitting: Hematology and Oncology

## 2020-06-09 VITALS — BP 111/62 | HR 79 | Temp 98.6°F | Resp 18 | Ht 70.0 in | Wt 173.2 lb

## 2020-06-09 DIAGNOSIS — Z79899 Other long term (current) drug therapy: Secondary | ICD-10-CM | POA: Diagnosis not present

## 2020-06-09 DIAGNOSIS — C7951 Secondary malignant neoplasm of bone: Secondary | ICD-10-CM | POA: Diagnosis not present

## 2020-06-09 DIAGNOSIS — I428 Other cardiomyopathies: Secondary | ICD-10-CM | POA: Diagnosis not present

## 2020-06-09 DIAGNOSIS — C8333 Diffuse large B-cell lymphoma, intra-abdominal lymph nodes: Secondary | ICD-10-CM | POA: Diagnosis not present

## 2020-06-09 DIAGNOSIS — G4730 Sleep apnea, unspecified: Secondary | ICD-10-CM | POA: Diagnosis not present

## 2020-06-09 DIAGNOSIS — D492 Neoplasm of unspecified behavior of bone, soft tissue, and skin: Secondary | ICD-10-CM

## 2020-06-09 DIAGNOSIS — Z923 Personal history of irradiation: Secondary | ICD-10-CM | POA: Diagnosis not present

## 2020-06-09 DIAGNOSIS — Z7901 Long term (current) use of anticoagulants: Secondary | ICD-10-CM

## 2020-06-09 DIAGNOSIS — I447 Left bundle-branch block, unspecified: Secondary | ICD-10-CM | POA: Diagnosis not present

## 2020-06-09 DIAGNOSIS — I10 Essential (primary) hypertension: Secondary | ICD-10-CM | POA: Diagnosis not present

## 2020-06-09 DIAGNOSIS — C61 Malignant neoplasm of prostate: Secondary | ICD-10-CM | POA: Diagnosis not present

## 2020-06-09 DIAGNOSIS — I350 Nonrheumatic aortic (valve) stenosis: Secondary | ICD-10-CM | POA: Diagnosis not present

## 2020-06-09 DIAGNOSIS — Z87891 Personal history of nicotine dependence: Secondary | ICD-10-CM | POA: Diagnosis not present

## 2020-06-09 NOTE — Progress Notes (Signed)
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 appearance of the prostate. PIRADS not assigned due to the presence of diffuse bony metastatic disease in the pelvis. Suspected extracapsular extension in the LEFT prostate base and potentially mid prostate. 2. RIGHT iliac, acetabular and pubic root involvement with involvement of the LEFT sacrum. Dedicated pelvic MRI for assessment of the bony pelvis may be helpful if there is clinical concern for pathologic fracture. No gross displacement of the bony pelvis is noted though study was not performed for fracture evaluation. 3. Urinary bladder is distended and trabeculated suggesting bladder outlet obstruction or neurogenic bladder. No distal ureteral dilation. Is Electronically Signed: By: Zetta Bills M.D. On: 05/21/2020 08:19   DG Thoracic Spine 1 View  Result Date: 05/23/2020 CLINICAL DATA:  Thoracic laminectomy. EXAM: OPERATIVE THORACIC SPINE VIEW(S) COMPARISON:  September 18, 2019 FINDINGS: Intraoperative fluoroscopic image of the thoracic spine demonstrates placement of marking devise the level of T11 vertebral body. IMPRESSION: Intraoperative fluoroscopic image of the thoracic spine  demonstrates placement of marking devise the level of T11 vertebral body. Electronically Signed   By: Fidela Salisbury M.D.   On: 05/23/2020 11:13   DG C-Arm 1-60 Min  Result Date: 05/23/2020 CLINICAL DATA:  Thoracic laminectomy. EXAM: DG C-ARM 1-60 MIN FLUOROSCOPY TIME:  Fluoroscopy Time:  7.6 seconds Radiation Exposure Index (if provided by the fluoroscopic device): 2.78 mGy Number of Acquired Spot Images: 0 COMPARISON:  September 18, 2019 FINDINGS: Intraoperative fluoroscopic image of the thoracic spine demonstrates placement of marking devise at the level of T11 vertebral body. IMPRESSION: Intraoperative fluoroscopic image of the thoracic spine demonstrates placement of marking device at the level of T11 vertebral body. Electronically Signed   By: Fidela Salisbury M.D.   On: 05/23/2020 11:12   DG Hip Unilat W or Wo Pelvis 2-3 Views Right  Result Date: 05/19/2020 CLINICAL DATA:  Right hip and low back pain after a fall this evening. EXAM: DG HIP (WITH OR  WITHOUT PELVIS) 2-3V RIGHT COMPARISON:  None. FINDINGS: No evidence of acute fracture or dislocation involving the pelvis or right hip. SI joints and symphysis pubis are nondisplaced. Narrowing and sclerosis in the SI joints may represent partial ankylosis or degenerative change. No focal bone lesion or bone destruction. Soft tissues are unremarkable. IMPRESSION: No acute bony abnormalities. Electronically Signed   By: Lucienne Capers M.D.   On: 05/19/2020 23:50    ASSESSMENT & PLAN Patrick [name] 86 y.o. male with medical history significant for Prostate cancer (Gleason 5+4), HTN, non-ischemic cardiomyopathy, and sleep apnea who presents for newly diagnosed DLBCL.  After review the labs, the records, discussion with the patient the findings are consistent with a diffuse large B-cell lymphoma in a patient with already known prostate cancer.  Unfortunately his picture is mottled at this time as it is unclear whether not certain lesions are caused  by the prostate cancer or this new diffuse large B-cell lymphoma.  It does appear that this is very aggressive given its involvement of the spine.  In order to understand the extent of his disease will require a PET CT scan.  Additionally given this lesion that he has in his hip it is not clear if it is lymphoma or prostate cancer and therefore we have requested IR to perform a biopsy of this lesion.  Once the extent of disease can be confirmed we can proceed with selection of a regimen.  Additional work-up would include an echocardiogram to see if the patient could tolerate anthracyclines and baseline blood work.  On discussion today the patient seemed hesitant about proceeding with chemotherapy.  Given his advanced age and his fairly aggressive disease if he were to select a comfort based approach I feel that this would be entirely reasonable.  His overall prognosis is poor given the aggressive nature of this disease and without treatment I would estimate his survival time as easily less than 1 year, potentially less than 6 months.  We can have further goals of care discussion once the PET CT scan is complete and we have all the information necessary to determine the best options for treatment moving forward.  At this time preferred regimens would include R-miniCHOP or R-GCVP (if patient were to have EF <50%). Patient was noted to have an EF 40-45% in March 2020, so we may need to avoid the use of anthracyclines.   # Diffuse Large B Cell Lymphoma, Staging In Process --patient will require a PET CT scan in order to confirm the extent of disease. At this time we only have spinal imaging from time of diagnosis.  --will order baseline labs to include CBC, CMP, LDH, Hep B/C, uric acid --patient will require TTE to determine his cardiac function prior to determining a regimen.  --RTC once the above workup has been completed. I estimate this will be early Jan 2022.   #Prostate Cancer (Gleason 5+4) on  ADT --recommend continuation of ADT therapy. This will not interfere with our treatment of his DLBCL --will clarify with patient where he would like to receive these shots. It would be preferable if the patient received all of his treatment at the same location --unclear if pelvic lesion is DLBCL or prostate cancer. Requesting biopsy to help determine which malignancy is responsible for this finding.  --continue to monitor  Orders Placed This Encounter  Procedures  . NM PET Image Initial (PI) Skull Base To Thigh    Standing Status:   Future    Standing  Expiration Date:   06/10/2021    Order Specific Question:   If indicated for the ordered procedure, I authorize the administration of a radiopharmaceutical per Radiology protocol    Answer:   Yes    Order Specific Question:   Preferred imaging location?    Answer:   [facility]  . CT Biopsy    Standing Status:   Future    Standing Expiration Date:   06/10/2021    Order Specific Question:   Lab orders requested (DO NOT place separate lab orders, these will be automatically ordered during procedure specimen collection):    Answer:   Surgical Pathology    Order Specific Question:   Reason for Exam (SYMPTOM  OR DIAGNOSIS REQUIRED)    Answer:   Pelvis lesion, unclear if lymphoma or prostate cancer    Order Specific Question:   Preferred location?    Answer:   [facility]  . CT GUIDED NEEDLE PLACEMENT    Standing Status:   Future    Standing Expiration Date:   06/10/2021    Order Specific Question:   If indicated for the ordered procedure, I authorize the administration of contrast media per Radiology protocol    Answer:   Yes    Order Specific Question:   Reason for Exam (SYMPTOM  OR DIAGNOSIS REQUIRED)    Answer:   Pelvic lesion, unsure if prostate cancer or lymphoma.    Order Specific Question:   Preferred imaging location?    Answer:   [facility]  . CBC with Differential ([place] Only)    Standing Status:    Future    Number of Occurrences:   1    Standing Expiration Date:   06/09/2021  . ECHOCARDIOGRAM COMPLETE    Echo required prior to administration of chemotherapy. Requesting this be done ASAP.    Standing Status:   Future    Standing Expiration Date:   06/10/2021    Order Specific Question:   Where should this test be performed    Answer:   [facility]    Order Specific Question:   Perflutren DEFINITY (image enhancing agent) should be administered unless hypersensitivity or allergy exist    Answer:   Administer Perflutren    Order Specific Question:   Does this study need to be read by the Structural team/Level 3 readers?    Answer:   No    Order Specific Question:   Reason for exam-Echo    Answer:   Chemo  Z09    All questions were answered. The patient knows to call the clinic with any problems, questions or concerns.  A total of more than 60 minutes were spent on this encounter and over half of that time was spent on counseling and coordination of care as outlined above.   Ledell Peoples, MD Department of Hematology/Oncology [place] [facility] Phone: [phone] Pager: [phone] Email: [email or web address]  06/11/2020 9:08 AM

## 2020-06-09 NOTE — Progress Notes (Signed)
I spoke with the patient, his wife, and daughter today to review the recommendations for radiotherapy to the T spine and adjacent ribs from T6-9 which Dr. Lorenso Courier is in agreement with. He is going to think about options of RCHOP chemotherapy and discuss in a few weeks again with Dr. Lorenso Courier. Written consent is obtained and placed in the chart, a copy was provided to the patient. He will simulate later this week and be contacted by our staff in order to proceed.

## 2020-06-11 ENCOUNTER — Other Ambulatory Visit: Payer: Self-pay

## 2020-06-11 ENCOUNTER — Ambulatory Visit
Admission: RE | Admit: 2020-06-11 | Discharge: 2020-06-11 | Disposition: A | Discharge status: Home / Self Care | Payer: Medicare Other | Source: Ambulatory Visit | Attending: Radiation Oncology | Admitting: Radiation Oncology

## 2020-06-11 ENCOUNTER — Telehealth: Payer: Self-pay | Admitting: Hematology and Oncology

## 2020-06-11 ENCOUNTER — Inpatient Hospital Stay: Payer: Medicare Other

## 2020-06-11 ENCOUNTER — Other Ambulatory Visit: Payer: Self-pay | Admitting: Hematology and Oncology

## 2020-06-11 DIAGNOSIS — C8333 Diffuse large B-cell lymphoma, intra-abdominal lymph nodes: Secondary | ICD-10-CM

## 2020-06-11 DIAGNOSIS — C8339 Diffuse large B-cell lymphoma, extranodal and solid organ sites: Secondary | ICD-10-CM | POA: Insufficient documentation

## 2020-06-11 DIAGNOSIS — Z510 Encounter for antineoplastic radiation therapy: Secondary | ICD-10-CM | POA: Diagnosis not present

## 2020-06-11 LAB — CMP (CANCER CENTER ONLY)
ALT: 21 U/L (ref 0–44)
AST: 21 U/L (ref 15–41)
Albumin: 3.1 g/dL — ABNORMAL LOW (ref 3.5–5.0)
Alkaline Phosphatase: 82 U/L (ref 38–126)
Anion gap: 7 (ref 5–15)
BUN: 18 mg/dL (ref 8–23)
CO2: 26 mmol/L (ref 22–32)
Calcium: 9.6 mg/dL (ref 8.9–10.3)
Chloride: 106 mmol/L (ref 98–111)
Creatinine: 0.86 mg/dL (ref 0.61–1.24)
GFR, Estimated: >60 mL/min (ref >60)
Glucose, Bld: 112 mg/dL — ABNORMAL HIGH (ref 70–99)
Potassium: 3.4 mmol/L — ABNORMAL LOW (ref 3.5–5.1)
Sodium: 139 mmol/L (ref 135–145)
Total Bilirubin: 0.7 mg/dL (ref 0.3–1.2)
Total Protein: 6.1 g/dL — ABNORMAL LOW (ref 6.5–8.1)

## 2020-06-11 LAB — HEPATITIS B SURFACE ANTIGEN: Hepatitis B Surface Ag: NONREACTIVE

## 2020-06-11 LAB — CBC WITH DIFFERENTIAL (CANCER CENTER ONLY)
Abs Immature Granulocytes: 0.02 10*3/uL (ref 0.00–0.07)
Basophils Absolute: 0 10*3/uL (ref 0.0–0.1)
Basophils Relative: 1 %
Eosinophils Absolute: 0.5 10*3/uL (ref 0.0–0.5)
Eosinophils Relative: 11 %
HCT: 31.3 % — ABNORMAL LOW (ref 39.0–52.0)
Hemoglobin: 10.4 g/dL — ABNORMAL LOW (ref 13.0–17.0)
Immature Granulocytes: 1 %
Lymphocytes Relative: 21 %
Lymphs Abs: 0.9 10*3/uL (ref 0.7–4.0)
MCH: 30.8 pg (ref 26.0–34.0)
MCHC: 33.2 g/dL (ref 30.0–36.0)
MCV: 92.6 fL (ref 80.0–100.0)
Monocytes Absolute: 0.4 10*3/uL (ref 0.1–1.0)
Monocytes Relative: 9 %
Neutro Abs: 2.5 10*3/uL (ref 1.7–7.7)
Neutrophils Relative %: 57 %
Platelet Count: 143 10*3/uL — ABNORMAL LOW (ref 150–400)
RBC: 3.38 MIL/uL — ABNORMAL LOW (ref 4.22–5.81)
RDW: 15 % (ref 11.5–15.5)
WBC Count: 4.3 10*3/uL (ref 4.0–10.5)
nRBC: 0 % (ref 0.0–0.2)

## 2020-06-11 LAB — URIC ACID: Uric Acid, Serum: 5 mg/dL (ref 3.7–8.6)

## 2020-06-11 LAB — HEPATITIS B SURFACE ANTIBODY,QUALITATIVE: Hep B S Ab: NONREACTIVE

## 2020-06-11 LAB — SAVE SMEAR(SSMR), FOR PROVIDER SLIDE REVIEW

## 2020-06-11 LAB — HEPATITIS B CORE ANTIBODY, TOTAL: Hep B Core Total Ab: NONREACTIVE

## 2020-06-11 LAB — LACTATE DEHYDROGENASE: LDH: 214 U/L — ABNORMAL HIGH (ref 98–192)

## 2020-06-11 LAB — HEPATITIS C ANTIBODY: HCV Ab: NONREACTIVE

## 2020-06-11 NOTE — Telephone Encounter (Signed)
Scheduled per los. Called and spoke with patient. Confirmed appt 

## 2020-06-14 ENCOUNTER — Other Ambulatory Visit: Payer: Self-pay

## 2020-06-14 ENCOUNTER — Ambulatory Visit (HOSPITAL_COMMUNITY)
Admission: RE | Admit: 2020-06-14 | Discharge: 2020-06-14 | Disposition: A | Discharge status: Home / Self Care | Payer: Medicare Other | Source: Ambulatory Visit | Attending: Hematology and Oncology | Admitting: Hematology and Oncology

## 2020-06-14 ENCOUNTER — Ambulatory Visit: Payer: Medicare Other | Admitting: Radiation Oncology

## 2020-06-14 DIAGNOSIS — R918 Other nonspecific abnormal finding of lung field: Secondary | ICD-10-CM | POA: Diagnosis not present

## 2020-06-14 DIAGNOSIS — C8333 Diffuse large B-cell lymphoma, intra-abdominal lymph nodes: Secondary | ICD-10-CM | POA: Diagnosis not present

## 2020-06-14 DIAGNOSIS — I2510 Atherosclerotic heart disease of native coronary artery without angina pectoris: Secondary | ICD-10-CM | POA: Insufficient documentation

## 2020-06-14 LAB — GLUCOSE, CAPILLARY: Glucose-Capillary: 95 mg/dL (ref 70–99)

## 2020-06-14 MED ORDER — FLUDEOXYGLUCOSE F - 18 (FDG) INJECTION
8.6000 | Freq: Once | INTRAVENOUS | Status: AC | PRN
  Administered 2020-06-14: 8.6 via INTRAVENOUS

## 2020-06-15 ENCOUNTER — Encounter (HOSPITAL_COMMUNITY): Payer: Self-pay

## 2020-06-15 ENCOUNTER — Ambulatory Visit: Payer: Medicare Other

## 2020-06-15 ENCOUNTER — Ambulatory Visit
Admission: RE | Admit: 2020-06-15 | Discharge: 2020-06-15 | Disposition: A | Discharge status: Home / Self Care | Payer: Medicare Other | Source: Ambulatory Visit | Attending: Radiation Oncology | Admitting: Radiation Oncology

## 2020-06-15 DIAGNOSIS — C8339 Diffuse large B-cell lymphoma, extranodal and solid organ sites: Secondary | ICD-10-CM | POA: Diagnosis not present

## 2020-06-15 LAB — BETA 2 MICROGLOBULIN, SERUM: Beta-2 Microglobulin: 2.4 mg/L (ref 0.6–2.4)

## 2020-06-15 NOTE — Progress Notes (Unsigned)
[place] Male, 86 y.o., 1932/04/06  MRN:  [id number] Phone:  [phone] Jerilynn Mages)       PCP:  Pcp, No Coverage:  [place] [facility] 06/15/2020 at 11:15 AM           RE: Biopsy Received: 4 days ago  Message Details  Aletta Edouard, MD  Lennox Solders E Need to wait until after PET. See if [facility] can do PET earlier. This is a sacral lesion so has to go to [facility] or one of the IR's that does VP/KP procedures.   GY    Previous Messages  ----- Message -----  From: Lenore Cordia  Sent: 06/11/2020 10:59 AM EST  To: Ir Procedure Requests  Subject: Biopsy                      Procedure Requested: CT Biopsy    Reason for Procedure: Pelvis lesion, unclear if lymphoma or prostate cancer    Provider Requesting: Dr Narda Rutherford  Provider Telephone: [phone]    Other Info: Future: Pet scheduled for December 29th

## 2020-06-16 ENCOUNTER — Ambulatory Visit
Admission: RE | Admit: 2020-06-16 | Discharge: 2020-06-16 | Disposition: A | Discharge status: Home / Self Care | Payer: Medicare Other | Source: Ambulatory Visit | Attending: Radiation Oncology | Admitting: Radiation Oncology

## 2020-06-16 DIAGNOSIS — C8339 Diffuse large B-cell lymphoma, extranodal and solid organ sites: Secondary | ICD-10-CM | POA: Diagnosis not present

## 2020-06-17 ENCOUNTER — Telehealth: Payer: Self-pay | Admitting: *Deleted

## 2020-06-17 ENCOUNTER — Encounter (HOSPITAL_COMMUNITY): Payer: Self-pay

## 2020-06-17 ENCOUNTER — Ambulatory Visit
Admission: RE | Admit: 2020-06-17 | Discharge: 2020-06-17 | Disposition: A | Discharge status: Home / Self Care | Payer: Medicare Other | Source: Ambulatory Visit | Attending: Radiation Oncology | Admitting: Radiation Oncology

## 2020-06-17 DIAGNOSIS — C8339 Diffuse large B-cell lymphoma, extranodal and solid organ sites: Secondary | ICD-10-CM | POA: Diagnosis not present

## 2020-06-17 NOTE — Progress Notes (Signed)
Patrick [name] Male, 86 y.o., 02/16/1932  MRN:  [id number] Phone:  [phone] Jerilynn Mages)       PCP:  Pcp, No Coverage:  [place] [facility] ([facility]) 07/05/2020 at 11:00 AM           RE: Biopsy Received: Today  Message Details  Orson Slick, MD  Lenore Cordia OK to hold for 48 hours.    Previous Messages  ----- Message -----  From: Lenore Cordia  Sent: 06/17/2020 11:30 AM EST  To: Orson Slick, MD  Subject: FW: Biopsy                    Hello Dr Lorenso Courier IV,   Your patient is on the blood thinner Eliquis  He will need to be off of it 48 hours prior to the biopsy  Your permission is needed.

## 2020-06-17 NOTE — Progress Notes (Unsigned)
Patrick [name] Male, 86 y.o., 03/23/1932  MRN:  [id number] Phone:  [phone] Jerilynn Mages)       PCP:  Pcp, No Coverage:  [place] [facility] ([facility]) 07/05/2020 at 11:00 AM           FW: Biopsy Received: Yesterday  Message Details  Erasmo Downer E Please schedule BX, I sent back for review after PET. See below, thanks!    Previous Messages  ----- Message -----  From: Sandi Mariscal, MD  Sent: 06/16/2020  3:21 PM EST  To: Anell Barr  Subject: RE: Biopsy                    CT guided Bx of ill defined lesion involving the right ilium;   Pelvic MRI - image 17, series 219; PET CT image 167, series 4.   As extra-spinal location, any IR MD can perform.   Cathren Harsh    ----- Message -----  From: Anell Barr  Sent: 06/16/2020  2:35 PM EST  To: Ir Procedure Requests  Subject: FW: Biopsy                    Pt had PET on 12/20, are we scheduling this as a regular CT biopsy, or will this be done in IR?    ----- Message -----  From: Lenore Cordia  Sent: 06/11/2020 12:31 PM EST  To: Elray Buba, *  Subject: FW: Biopsy                    See Dr Margaretmary Dys response...   Also, Patients pet has been moved up to Monday Dec 20th   Jan the tech Jodelle Gross stated patient is authorized. [facility] nursing facility is aware of date and time change.  ----- Message -----  From: Aletta Edouard, MD  Sent: 06/11/2020 11:41 AM EST  To: Lenore Cordia  Subject: RE: Biopsy                    Need to wait until after PET. See if [facility] can do PET earlier. This is a sacral lesion so has to go to [facility] or one of the IR's that does VP/KP procedures.   GY    ----- Message -----  From: Lenore Cordia  Sent: 06/11/2020 10:59 AM EST  To: Ir Procedure Requests  Subject: Biopsy                       Procedure Requested: CT Biopsy    Reason for Procedure: Pelvis lesion, unclear if lymphoma or prostate cancer    Provider Requesting: Dr Narda Rutherford  Provider Telephone: [phone]    Other Info: Future: Pet scheduled for December 29th

## 2020-06-17 NOTE — Telephone Encounter (Signed)
Received call from pt's daughter, Manuella Ghazi [facility]?) stating she had questions. She left a phone # but was unable to have the call go through. Of note-I did not see her name on any ROI forms.

## 2020-06-21 ENCOUNTER — Ambulatory Visit
Admission: RE | Admit: 2020-06-21 | Discharge: 2020-06-21 | Disposition: A | Discharge status: Home / Self Care | Payer: Medicare Other | Source: Ambulatory Visit | Attending: Radiation Oncology | Admitting: Radiation Oncology

## 2020-06-21 ENCOUNTER — Other Ambulatory Visit: Payer: Self-pay

## 2020-06-21 DIAGNOSIS — C8339 Diffuse large B-cell lymphoma, extranodal and solid organ sites: Secondary | ICD-10-CM | POA: Diagnosis not present

## 2020-06-22 ENCOUNTER — Ambulatory Visit
Admission: RE | Admit: 2020-06-22 | Discharge: 2020-06-22 | Disposition: A | Discharge status: Home / Self Care | Payer: Medicare Other | Source: Ambulatory Visit | Attending: Radiation Oncology | Admitting: Radiation Oncology

## 2020-06-22 DIAGNOSIS — C8339 Diffuse large B-cell lymphoma, extranodal and solid organ sites: Secondary | ICD-10-CM | POA: Diagnosis not present

## 2020-06-23 ENCOUNTER — Telehealth: Payer: Self-pay | Admitting: *Deleted

## 2020-06-23 ENCOUNTER — Ambulatory Visit
Admission: RE | Admit: 2020-06-23 | Discharge: 2020-06-23 | Disposition: A | Discharge status: Home / Self Care | Payer: Medicare Other | Source: Ambulatory Visit | Attending: Radiation Oncology | Admitting: Radiation Oncology

## 2020-06-23 ENCOUNTER — Ambulatory Visit (HOSPITAL_COMMUNITY): Payer: Medicare Other

## 2020-06-23 ENCOUNTER — Other Ambulatory Visit: Payer: Self-pay

## 2020-06-23 DIAGNOSIS — C8339 Diffuse large B-cell lymphoma, extranodal and solid organ sites: Secondary | ICD-10-CM | POA: Diagnosis not present

## 2020-06-23 NOTE — Telephone Encounter (Signed)
Received call from front desk requesting that patient would like to speak with this nurse. He is here for his radiation. Spoke with patient in the lobby. He states he has decided he does not want chemotherapy and would like his Echocardiogram cancelled as this was ordered in preparation for chemo. He still would like the CT guided biopsy done.  Advised I would cancel the echo and try to find out when he can expect to have the biopsy done. Biopsy is scheduled for 07/06/19 @ 11am. Message given to front desk in RadOnc to pass on to patient.

## 2020-06-24 ENCOUNTER — Encounter (HOSPITAL_COMMUNITY): Payer: Self-pay | Admitting: Oncology

## 2020-06-24 ENCOUNTER — Ambulatory Visit
Admission: RE | Admit: 2020-06-24 | Discharge: 2020-06-24 | Disposition: A | Discharge status: Home / Self Care | Payer: Medicare Other | Source: Ambulatory Visit | Attending: Radiation Oncology | Admitting: Radiation Oncology

## 2020-06-24 DIAGNOSIS — C8339 Diffuse large B-cell lymphoma, extranodal and solid organ sites: Secondary | ICD-10-CM | POA: Diagnosis not present

## 2020-06-24 NOTE — Patient Instructions (Addendum)
What is RITUXAN?  RITUXAN (rituximab) is a prescription medicine used to treat adults with: Non-Hodgkin's Lymphoma (NHL): alone or with other chemotherapy medicines. Chronic Lymphocytic Leukemia (CLL): with the chemotherapy medicines fludarabine and cyclophosphamide. People with serious infections should not receive RITUXAN. It is not known if RITUXAN is safe or effective in children with NHL or CLL.   RITUXAN is not chemotherapy RITUXAN is a type of antibody therapy that can be used alone or with chemotherapy. They work in different ways to find and attack the cells where cancer starts.  RITUXAN targets and attaches to the CD20 protein found on the surface of blood cells with cancer and some healthy blood cells. Once attached to the CD20 protein, RITUXAN is thought to work in different ways including:  By helping your own immune system destroy the cancer cells By destroying the cancer cells on its own In addition, RITUXAN can also harm healthy cells in your body.  RITUXAN can be an important part of treatment for many patients with NHL and CLL.  NEXT: STARTING RITUXAN THERAPY  RITUXAN is available by prescription only.  Important Safety Information See More  INDICATIONS RITUXAN (rituximab) is a prescription medicine used to treat adults with: Non-Hodgkin's Lymphoma (NHL): alone or with other chemotherapy medicines.  Chronic Lymphocytic Leukemia (CLL): with the chemotherapy medicines fludarabine and cyclophosphamide.  It is not known if RITUXAN is safe and effective in children with NHL or CLL.  What is the most important information I should know about RITUXAN? RITUXAN can cause serious side effects that can lead to death, including:  Infusion-Related Reactions: Infusion-related reactions are very common side effects of RITUXAN treatment. Serious infusion-related reactions can happen during your infusion or within 24 hours after your infusion of RITUXAN. Your healthcare  provider should give you medicines before your infusion of RITUXAN to decrease your chance of having a severe infusion-related reactions. Tell your healthcare provider or get medical help right away if you get any of these symptoms during or after an infusion of RITUXAN: Hives (red itchy welts) or rash Itching Swelling of your lips, tongue, throat, or face Sudden cough Shortness of breath, difficulty breathing, or wheezing Weakness Dizziness or feel faint Palpitations (feel like your heart is racing or fluttering) Chest pain Severe Skin and Mouth Reactions: Tell your healthcare provider or get medical help right away if you get any of these symptoms at any time during your treatment with RITUXAN: Painful sores or ulcers on your skin, lips, or in your mouth Blisters Peeling skin Rash Pustules Hepatitis B Virus (HBV) Reactivation: Before you receive your RITUXAN treatment, your healthcare provider will do blood tests to check for HBV infection. If you have had hepatitis B or are a carrier of hepatitis B virus, receiving RITUXAN could cause the virus to become an active infection again. Hepatitis B reactivation may cause serious liver problems, including liver failure, and death. Your healthcare provider will monitor you for hepatitis B infection during and for several months after you stop receiving RITUXAN. Tell your healthcare provider right away if you get worsening tiredness, or yellowing of your skin or white part of your eyes during treatment with RITUXAN Progressive Multifocal Leukoencephalopathy (PML): PML is a rare, serious brain infection caused by a virus that can happen in people who receive RITUXAN. People with weakened immune systems can get PML. PML can result in death or severe disability. There is no known treatment, prevention, or cure for PML. Tell your healthcare provider right away if you  have new or worsening symptoms or if anyone close to you notices these  symptoms: Confusion Dizziness or loss of balance Difficulty walking or talking Decreased strength or weakness on one side of your body Vision problems, such as blurred vision or loss of vision What should I tell my healthcare provider before receiving RITUXAN? Before receiving RITUXAN, tell your healthcare provider if you:  Have had a severe reaction to RITUXAN or a rituximab product Have a history of heart problems, irregular heartbeat, or chest pain Have lung or kidney problems Have had an infection, currently have an infection, or have a weakened immune system Have or have had any severe infections including: Hepatitis B virus (HBV) Hepatitis C virus (HCV) Cytomegalovirus (CMV) Herpes simplex virus (HSV) Parvovirus B19 Varicella zoster virus (chickenpox or shingles) West Nile Virus Have had a recent vaccination or are scheduled to receive vaccinations. You should not receive certain vaccines before or during treatment with RITUXAN Have any other medical conditions Are pregnant or plan to become pregnant. Talk to your healthcare provider about the risks to your unborn baby if you receive RITUXAN during pregnancy. Females who are able to become pregnant should use effective birth control (contraception) during treatment with RITUXAN and for 12 months after the last dose of RITUXAN. Talk to your healthcare provider about effective birth control. Tell your healthcare provider right away if you become pregnant or think that you are pregnant during treatment with RITUXAN Are breastfeeding or plan to breastfeed. It is not known if RITUXAN passes into your breast milk. Do not breastfeed during treatment and for at least 6 months after your last dose of RITUXAN Are taking any medications, including prescription and over-the-counter medicines, vitamins, and herbal supplements What are the possible side effects of RITUXAN? RITUXAN can cause serious side effects, including:  Tumor Lysis Syndrome  (TLS): TLS is caused by the fast breakdown of cancer cells. TLS can cause you to have: Kidney failure and the need for dialysis treatment Abnormal heart rhythm TLS can happen within 12 to 24 hours after an infusion of RITUXAN. Your healthcare provider may do blood tests to check you for TLS. Your healthcare provider may give you medicine to help prevent TLS. Tell your healthcare provider right away if you have any of the following signs or symptoms of TLS: Nausea Vomiting Diarrhea Lack of energy Serious Infections: Serious infections can happen during and after treatment with RITUXAN, and can lead to death. RITUXAN can increase your risk of getting infections and can lower the ability of your immune system to fight infections. Types of serious infections that can happen with RITUXAN include bacterial, fungal, and viral infections. After receiving RITUXAN, some people have developed low levels of certain antibodies in their blood for a long period of time (longer than 11 months). Some of these patients with low antibody levels developed infections. People with serious infections should not receive RITUXAN. Tell your healthcare provider right away if you have any symptoms of infection: Fever Cold symptoms, such as runny nose or sore throat that do not go away Flu symptoms, such as cough, tiredness, and body aches Earache or headache Pain during urination Cold sores in the mouth or throat Cuts, scrapes, or incisions that are red, warm, swollen, or painful Heart Problems: RITUXAN may cause chest pain, irregular heartbeats, and heart attack. Your healthcare provider may monitor your heart during and after treatment with RITUXAN if you have symptoms of heart problems or have a history of heart problems. Tell your  healthcare provider right away if you have chest pain or irregular heart-beats during treatment with RITUXAN Kidney Problems: especially if you are receiving RITUXAN for NHL. RITUXAN can cause  severe kidney problems that lead to death. Your healthcare provider should do blood tests to check how well your kidneys are working Stomach and Serious Bowel Problems That Can Sometimes Lead to Death: Bowel problems, including blockage or tears in the bowel can happen if you receive RITUXAN with chemotherapy medicines. Tell your healthcare provider right away if you have any stomach-area (abdomen) pain or repeated vomiting during treatment with RITUXAN Your healthcare provider will stop treatment with RITUXAN if you have severe, serious, or life-threatening side effects.  What are the most common side effects during treatment with RITUXAN? Infusion-related reactions Infections (may include fever, chills) Body aches Tiredness Nausea Other side effects include:  Aching joints during or within hours of receiving an infusion More frequent upper respiratory tract infections These are not all of the possible side effects with RITUXAN.  Please see the RITUXAN full Prescribing Information, including the Medication Guide, for additional Important Safety Information.  You may report side effects to the FDA at (800) FDA-1088 or [employer]. You may also report side effects to [facility] at 973-573-6178.

## 2020-06-28 ENCOUNTER — Other Ambulatory Visit: Payer: Self-pay

## 2020-06-28 ENCOUNTER — Ambulatory Visit
Admission: RE | Admit: 2020-06-28 | Discharge: 2020-06-28 | Disposition: A | Discharge status: Home / Self Care | Payer: Medicare Other | Source: Ambulatory Visit | Attending: Radiation Oncology | Admitting: Radiation Oncology

## 2020-06-28 DIAGNOSIS — C8518 Unspecified B-cell lymphoma, lymph nodes of multiple sites: Secondary | ICD-10-CM | POA: Insufficient documentation

## 2020-06-28 DIAGNOSIS — Z510 Encounter for antineoplastic radiation therapy: Secondary | ICD-10-CM | POA: Insufficient documentation

## 2020-06-29 ENCOUNTER — Other Ambulatory Visit: Payer: Self-pay

## 2020-06-29 ENCOUNTER — Ambulatory Visit: Payer: Medicare Other

## 2020-06-29 ENCOUNTER — Ambulatory Visit
Admission: RE | Admit: 2020-06-29 | Discharge: 2020-06-29 | Disposition: A | Discharge status: Home / Self Care | Payer: Medicare Other | Source: Ambulatory Visit | Attending: Radiation Oncology | Admitting: Radiation Oncology

## 2020-06-29 DIAGNOSIS — Z510 Encounter for antineoplastic radiation therapy: Secondary | ICD-10-CM | POA: Diagnosis not present

## 2020-06-30 ENCOUNTER — Encounter: Payer: Self-pay | Admitting: Radiation Oncology

## 2020-06-30 ENCOUNTER — Ambulatory Visit
Admission: RE | Admit: 2020-06-30 | Discharge: 2020-06-30 | Disposition: A | Discharge status: Home / Self Care | Payer: Medicare Other | Source: Ambulatory Visit | Attending: Radiation Oncology | Admitting: Radiation Oncology

## 2020-06-30 ENCOUNTER — Telehealth: Payer: Self-pay | Admitting: *Deleted

## 2020-06-30 ENCOUNTER — Other Ambulatory Visit: Payer: Self-pay | Admitting: *Deleted

## 2020-06-30 DIAGNOSIS — C8333 Diffuse large B-cell lymphoma, intra-abdominal lymph nodes: Secondary | ICD-10-CM

## 2020-06-30 DIAGNOSIS — Z510 Encounter for antineoplastic radiation therapy: Secondary | ICD-10-CM | POA: Diagnosis not present

## 2020-06-30 NOTE — Telephone Encounter (Signed)
Received call from pt, who is inquiring about tthe CT guided biopsy scheduled for 07/05/20 of an area in one of his hips. Pt wanted to know why he was having this done. Advised that it was being done to determine if the area in question was related to his prostate cancer or his lymphoma.Pt was uncertain if it really mattered what it was-he has decided not to take any chemotherapy for the lymphoma and he would like to know what his most recent PSA is before deciding if he wants this hip biopsy. He states he was getting 'injections' for his prostate cancer, his last one being 04/28/20. His PSA at the time was 0.1 He is asking to cancel the biopsy and to have his PSA re-checked at his next visit.  Dr. Leonides Schanz made aware

## 2020-07-02 ENCOUNTER — Other Ambulatory Visit (HOSPITAL_COMMUNITY): Payer: Medicare Other

## 2020-07-05 ENCOUNTER — Ambulatory Visit (HOSPITAL_COMMUNITY): Admission: RE | Admit: 2020-07-05 | Payer: Medicare Other | Source: Ambulatory Visit

## 2020-07-07 ENCOUNTER — Inpatient Hospital Stay: Payer: Medicare Other | Attending: Radiation Oncology | Admitting: Hematology and Oncology

## 2020-07-07 ENCOUNTER — Inpatient Hospital Stay: Payer: Medicare Other

## 2020-07-07 ENCOUNTER — Encounter: Payer: Self-pay | Admitting: Hematology and Oncology

## 2020-07-07 ENCOUNTER — Other Ambulatory Visit: Payer: Self-pay

## 2020-07-07 VITALS — BP 114/76 | HR 60 | Temp 97.7°F | Resp 20 | Ht 70.0 in | Wt 174.0 lb

## 2020-07-07 DIAGNOSIS — Z923 Personal history of irradiation: Secondary | ICD-10-CM | POA: Diagnosis not present

## 2020-07-07 DIAGNOSIS — Z7901 Long term (current) use of anticoagulants: Secondary | ICD-10-CM | POA: Diagnosis not present

## 2020-07-07 DIAGNOSIS — I428 Other cardiomyopathies: Secondary | ICD-10-CM | POA: Insufficient documentation

## 2020-07-07 DIAGNOSIS — Z79899 Other long term (current) drug therapy: Secondary | ICD-10-CM | POA: Insufficient documentation

## 2020-07-07 DIAGNOSIS — I447 Left bundle-branch block, unspecified: Secondary | ICD-10-CM | POA: Diagnosis not present

## 2020-07-07 DIAGNOSIS — C8333 Diffuse large B-cell lymphoma, intra-abdominal lymph nodes: Secondary | ICD-10-CM

## 2020-07-07 DIAGNOSIS — D492 Neoplasm of unspecified behavior of bone, soft tissue, and skin: Secondary | ICD-10-CM | POA: Diagnosis not present

## 2020-07-07 DIAGNOSIS — Z87891 Personal history of nicotine dependence: Secondary | ICD-10-CM | POA: Insufficient documentation

## 2020-07-07 DIAGNOSIS — C8338 Diffuse large B-cell lymphoma, lymph nodes of multiple sites: Secondary | ICD-10-CM | POA: Diagnosis not present

## 2020-07-07 DIAGNOSIS — C61 Malignant neoplasm of prostate: Secondary | ICD-10-CM | POA: Diagnosis not present

## 2020-07-07 DIAGNOSIS — I10 Essential (primary) hypertension: Secondary | ICD-10-CM | POA: Insufficient documentation

## 2020-07-07 LAB — CMP (CANCER CENTER ONLY)
ALT: 14 U/L (ref 0–44)
AST: 20 U/L (ref 15–41)
Albumin: 3.5 g/dL (ref 3.5–5.0)
Alkaline Phosphatase: 64 U/L (ref 38–126)
Anion gap: 7 (ref 5–15)
BUN: 23 mg/dL (ref 8–23)
CO2: 25 mmol/L (ref 22–32)
Calcium: 9.6 mg/dL (ref 8.9–10.3)
Chloride: 107 mmol/L (ref 98–111)
Creatinine: 1.03 mg/dL (ref 0.61–1.24)
GFR, Estimated: >60 mL/min (ref >60)
Glucose, Bld: 119 mg/dL — ABNORMAL HIGH (ref 70–99)
Potassium: 3.7 mmol/L (ref 3.5–5.1)
Sodium: 139 mmol/L (ref 135–145)
Total Bilirubin: 0.4 mg/dL (ref 0.3–1.2)
Total Protein: 6.2 g/dL — ABNORMAL LOW (ref 6.5–8.1)

## 2020-07-07 LAB — CBC WITH DIFFERENTIAL (CANCER CENTER ONLY)
Abs Immature Granulocytes: 0.02 10*3/uL (ref 0.00–0.07)
Basophils Absolute: 0 10*3/uL (ref 0.0–0.1)
Basophils Relative: 1 %
Eosinophils Absolute: 0.1 10*3/uL (ref 0.0–0.5)
Eosinophils Relative: 2 %
HCT: 33.3 % — ABNORMAL LOW (ref 39.0–52.0)
Hemoglobin: 11.2 g/dL — ABNORMAL LOW (ref 13.0–17.0)
Immature Granulocytes: 1 %
Lymphocytes Relative: 9 %
Lymphs Abs: 0.4 10*3/uL — ABNORMAL LOW (ref 0.7–4.0)
MCH: 31.3 pg (ref 26.0–34.0)
MCHC: 33.6 g/dL (ref 30.0–36.0)
MCV: 93 fL (ref 80.0–100.0)
Monocytes Absolute: 0.5 10*3/uL (ref 0.1–1.0)
Monocytes Relative: 13 %
Neutro Abs: 3.1 10*3/uL (ref 1.7–7.7)
Neutrophils Relative %: 74 %
Platelet Count: 141 10*3/uL — ABNORMAL LOW (ref 150–400)
RBC: 3.58 MIL/uL — ABNORMAL LOW (ref 4.22–5.81)
RDW: 14.8 % (ref 11.5–15.5)
WBC Count: 4.1 10*3/uL (ref 4.0–10.5)
nRBC: 0 % (ref 0.0–0.2)

## 2020-07-07 LAB — LACTATE DEHYDROGENASE: LDH: 164 U/L (ref 98–192)
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
## 2020-07-08 LAB — PROSTATE-SPECIFIC AG, SERUM (LABCORP): Prostate Specific Ag, Serum: <0.1 ng/mL (ref 0.0–4.0)

## 2020-07-08 LAB — TESTOSTERONE: Testosterone: <3 ng/dL — ABNORMAL LOW (ref 264–916)

## 2020-07-12 ENCOUNTER — Telehealth: Payer: Self-pay | Admitting: *Deleted

## 2020-07-12 NOTE — Telephone Encounter (Signed)
Received message from pt's daughter-in-law, [name] from the weekend, asking about monoclonal antibodies.. TCT Rachel. She states Mr. Hadley tested positive for covid on Friday, 07/09/20. She was asking if he could get the antibodies.  She ended up speaking with pt's hospice nurse and was told told he did not qualify due to being on hospice services.  [name] states she is ok with this.  He had fever for 24 hours, has a cough and feels weak.  They are planning to take him home from [facility] after his quarantine is up in 10 days.  Neither [name] nor the pt's wife tested positive for Covid.  Dr. Lorenso Courier made aware.

## 2020-07-14 ENCOUNTER — Telehealth: Payer: Self-pay | Admitting: *Deleted

## 2020-07-14 NOTE — Telephone Encounter (Signed)
-----   Message from Orson Slick, MD sent at 07/14/2020 11:29 AM EST ----- Please let Mr. Haeberle know that his PSA and testosterone are undetectable. I know he had interest in having his ADT shots restarted, however since he is on hospice I do not think they would be of much benefit. Given that his PSA is undetectable I do not think he requires an ADT shot at this time. If he has questions or concerns about that I would be happy to discuss it with him.   ----- Message ----- From: Interface, Lab In [place] Sent: 07/07/2020   3:41 PM EST To: Orson Slick, MD

## 2020-07-14 NOTE — Telephone Encounter (Signed)
TCT patient regarding PSA and testosterone lab results.  No answer but was able to leave detailed message on identified phone #. Advised that his PSA and testosterone levels were undetectable and that Dr. Lorenso Courier does not recommend resuming his ADT injections.   Advised that patient could call back with any questions or concerns.

## 2020-07-19 ENCOUNTER — Telehealth: Payer: Self-pay | Admitting: Hematology and Oncology

## 2020-07-19 NOTE — Telephone Encounter (Signed)
Called to inform patient of his upcoming appointment. Patient is aware. 

## 2020-07-25 NOTE — Progress Notes (Signed)
  Radiation Oncology         [phone] ________________________________  Name: Patrick [name] MRN: [id number]  Date: 06/30/2020  DOB: [birth year]  End of Treatment Note  Diagnosis:   diffuse large B-cell lymphoma      Indication for treatment::  palliative       Radiation treatment dates:   06/15/20 - 06/30/20  Site/dose:   The T-spine T6-T10 was treated to a dose of 25 Gy in 10 fractions using a 2-field isodose plan.    Narrative: The patient tolerated radiation treatment relatively well.     Plan: The patient has completed radiation treatment. The patient will return to radiation oncology clinic for routine followup in one month. I advised the patient to call or return sooner if they have any questions or concerns related to their recovery or treatment. ________________________________  Jodelle Gross, M.D., Ph.D.

## 2020-07-26 ENCOUNTER — Ambulatory Visit
Admission: RE | Admit: 2020-07-26 | Discharge: 2020-07-26 | Disposition: A | Discharge status: Home / Self Care | Payer: Medicare Other | Source: Ambulatory Visit | Attending: Radiation Oncology | Admitting: Radiation Oncology

## 2020-07-26 ENCOUNTER — Other Ambulatory Visit: Payer: Self-pay

## 2020-07-26 DIAGNOSIS — C8330 Diffuse large B-cell lymphoma, unspecified site: Secondary | ICD-10-CM

## 2020-07-26 NOTE — Progress Notes (Signed)
  Radiation Oncology         [phone] ________________________________  Name: Patrick [name] MRN: [id number]  Date of Service: 07/26/2020  DOB: January 21, 1932  Post Treatment Telephone Note  Diagnosis:   Diffuse large B-cell lymphoma  Interval Since Last Radiation:  4 weeks   06/15/20 - 06/30/20: The T-spine T6-T10 was treated to a dose of 25 Gy in 10 fractions using a 2-field isodose plan.    Narrative:  The patient was contacted today for routine follow-up. During treatment he did very well with radiotherapy and did not have significant desquamation. He reports he is doing very well and back home now. He verbalizes no concerns at this time.  Impression/Plan: 1. Diffuse large B-cell lymphoma. The patient has been doing well since completion of radiotherapy. We discussed that we would be happy to continue to follow him as needed, but he will also continue to follow up with Dr. Lorenso Courier in medical oncology.       Carola Rhine, PAC

## 2020-08-27 ENCOUNTER — Inpatient Hospital Stay (HOSPITAL_COMMUNITY)
Admission: EM | Admit: 2020-08-27 | Discharge: 2020-09-03 | DRG: 602 | Disposition: A | Discharge status: Hospice | Attending: Internal Medicine | Admitting: Internal Medicine

## 2020-08-27 ENCOUNTER — Other Ambulatory Visit: Payer: Self-pay

## 2020-08-27 ENCOUNTER — Encounter (HOSPITAL_COMMUNITY): Payer: Self-pay | Admitting: Pharmacy Technician

## 2020-08-27 DIAGNOSIS — D649 Anemia, unspecified: Secondary | ICD-10-CM | POA: Diagnosis present

## 2020-08-27 DIAGNOSIS — Z515 Encounter for palliative care: Secondary | ICD-10-CM

## 2020-08-27 DIAGNOSIS — D509 Iron deficiency anemia, unspecified: Secondary | ICD-10-CM | POA: Diagnosis present

## 2020-08-27 DIAGNOSIS — Z20822 Contact with and (suspected) exposure to covid-19: Secondary | ICD-10-CM | POA: Diagnosis present

## 2020-08-27 DIAGNOSIS — Z8249 Family history of ischemic heart disease and other diseases of the circulatory system: Secondary | ICD-10-CM

## 2020-08-27 DIAGNOSIS — E43 Unspecified severe protein-calorie malnutrition: Secondary | ICD-10-CM | POA: Diagnosis present

## 2020-08-27 DIAGNOSIS — Z66 Do not resuscitate: Secondary | ICD-10-CM | POA: Diagnosis present

## 2020-08-27 DIAGNOSIS — Z6824 Body mass index (BMI) 24.0-24.9, adult: Secondary | ICD-10-CM

## 2020-08-27 DIAGNOSIS — L03116 Cellulitis of left lower limb: Principal | ICD-10-CM | POA: Diagnosis present

## 2020-08-27 DIAGNOSIS — C61 Malignant neoplasm of prostate: Secondary | ICD-10-CM | POA: Diagnosis present

## 2020-08-27 DIAGNOSIS — E222 Syndrome of inappropriate secretion of antidiuretic hormone: Secondary | ICD-10-CM | POA: Diagnosis present

## 2020-08-27 DIAGNOSIS — G4730 Sleep apnea, unspecified: Secondary | ICD-10-CM | POA: Diagnosis present

## 2020-08-27 DIAGNOSIS — C8330 Diffuse large B-cell lymphoma, unspecified site: Secondary | ICD-10-CM | POA: Diagnosis present

## 2020-08-27 DIAGNOSIS — I8002 Phlebitis and thrombophlebitis of superficial vessels of left lower extremity: Secondary | ICD-10-CM | POA: Diagnosis present

## 2020-08-27 DIAGNOSIS — Z79899 Other long term (current) drug therapy: Secondary | ICD-10-CM

## 2020-08-27 DIAGNOSIS — D519 Vitamin B12 deficiency anemia, unspecified: Secondary | ICD-10-CM | POA: Diagnosis present

## 2020-08-27 DIAGNOSIS — R7302 Impaired glucose tolerance (oral): Secondary | ICD-10-CM | POA: Diagnosis present

## 2020-08-27 DIAGNOSIS — Z7901 Long term (current) use of anticoagulants: Secondary | ICD-10-CM

## 2020-08-27 DIAGNOSIS — Z87891 Personal history of nicotine dependence: Secondary | ICD-10-CM

## 2020-08-27 DIAGNOSIS — C799 Secondary malignant neoplasm of unspecified site: Secondary | ICD-10-CM | POA: Diagnosis present

## 2020-08-27 DIAGNOSIS — G9341 Metabolic encephalopathy: Secondary | ICD-10-CM | POA: Diagnosis present

## 2020-08-27 DIAGNOSIS — R5381 Other malaise: Secondary | ICD-10-CM | POA: Diagnosis present

## 2020-08-27 DIAGNOSIS — I4819 Other persistent atrial fibrillation: Secondary | ICD-10-CM | POA: Diagnosis present

## 2020-08-27 DIAGNOSIS — I350 Nonrheumatic aortic (valve) stenosis: Secondary | ICD-10-CM | POA: Diagnosis present

## 2020-08-27 DIAGNOSIS — I110 Hypertensive heart disease with heart failure: Secondary | ICD-10-CM | POA: Diagnosis present

## 2020-08-27 DIAGNOSIS — E861 Hypovolemia: Secondary | ICD-10-CM | POA: Diagnosis present

## 2020-08-27 DIAGNOSIS — I447 Left bundle-branch block, unspecified: Secondary | ICD-10-CM | POA: Diagnosis present

## 2020-08-27 DIAGNOSIS — E871 Hypo-osmolality and hyponatremia: Secondary | ICD-10-CM | POA: Diagnosis present

## 2020-08-27 DIAGNOSIS — I428 Other cardiomyopathies: Secondary | ICD-10-CM | POA: Diagnosis present

## 2020-08-27 DIAGNOSIS — I5022 Chronic systolic (congestive) heart failure: Secondary | ICD-10-CM | POA: Diagnosis present

## 2020-08-27 DIAGNOSIS — Z883 Allergy status to other anti-infective agents status: Secondary | ICD-10-CM

## 2020-08-27 DIAGNOSIS — M728 Other fibroblastic disorders: Secondary | ICD-10-CM | POA: Diagnosis present

## 2020-08-27 LAB — CBC WITH DIFFERENTIAL/PLATELET
Abs Immature Granulocytes: 0.05 10*3/uL (ref 0.00–0.07)
Basophils Absolute: 0 10*3/uL (ref 0.0–0.1)
Basophils Relative: 0 %
Eosinophils Absolute: 0 10*3/uL (ref 0.0–0.5)
Eosinophils Relative: 1 %
HCT: 27.3 % — ABNORMAL LOW (ref 39.0–52.0)
Hemoglobin: 8.7 g/dL — ABNORMAL LOW (ref 13.0–17.0)
Immature Granulocytes: 1 %
Lymphocytes Relative: 9 %
Lymphs Abs: 0.5 10*3/uL — ABNORMAL LOW (ref 0.7–4.0)
MCH: 29.4 pg (ref 26.0–34.0)
MCHC: 31.9 g/dL (ref 30.0–36.0)
MCV: 92.2 fL (ref 80.0–100.0)
Monocytes Absolute: 0.7 10*3/uL (ref 0.1–1.0)
Monocytes Relative: 12 %
Neutro Abs: 5 10*3/uL (ref 1.7–7.7)
Neutrophils Relative %: 77 %
Platelets: 309 10*3/uL (ref 150–400)
RBC: 2.96 MIL/uL — ABNORMAL LOW (ref 4.22–5.81)
RDW: 14.7 % (ref 11.5–15.5)
WBC: 6.4 10*3/uL (ref 4.0–10.5)
nRBC: 0 % (ref 0.0–0.2)

## 2020-08-27 LAB — COMPREHENSIVE METABOLIC PANEL
ALT: 31 U/L (ref 0–44)
AST: 35 U/L (ref 15–41)
Albumin: 2.4 g/dL — ABNORMAL LOW (ref 3.5–5.0)
Alkaline Phosphatase: 74 U/L (ref 38–126)
Anion gap: 9 (ref 5–15)
BUN: 20 mg/dL (ref 8–23)
CO2: 25 mmol/L (ref 22–32)
Calcium: 9.3 mg/dL (ref 8.9–10.3)
Chloride: 94 mmol/L — ABNORMAL LOW (ref 98–111)
Creatinine, Ser: 0.76 mg/dL (ref 0.61–1.24)
GFR, Estimated: >60 mL/min (ref >60)
Glucose, Bld: 99 mg/dL (ref 70–99)
Potassium: 4 mmol/L (ref 3.5–5.1)
Sodium: 128 mmol/L — ABNORMAL LOW (ref 135–145)
Total Bilirubin: 0.6 mg/dL (ref 0.3–1.2)
Total Protein: 6 g/dL — ABNORMAL LOW (ref 6.5–8.1)

## 2020-08-27 LAB — LACTIC ACID, PLASMA: Lactic Acid, Venous: 1.1 mmol/L (ref 0.5–1.9)

## 2020-08-27 NOTE — ED Triage Notes (Signed)
Pt sent here from PCP with reports of worsening cellulitis to his upper legs despite being treated with antibiotics. PCP office reports pt BP in office 83/54.

## 2020-08-27 NOTE — Progress Notes (Addendum)
[facility])       This patient is enrolled in hospice services in the community, admitted 07/09/20 with a terminal diagnosis of large B cell lymphoma.  Pt has been assessed multiple times since 08/19/20 for redness, pain  and heat to L thigh with a more pronounced palpable mass. Pt has been on Eliquis 5mg  BID since prior to admission.   Pt has completed 7 days of Keflex 500 mg TID with no improvement.  Doxycycline 100 mg BID as ordered but pt elected to see primary care for evaluation and was sent to ED. [facility] MD noted concern for terminal disease progression.  Oxycodone 5mg  q4h PRN pain added for pain relief.    [facility] will continue to follow for any discharge planning needs and to coordinate continuation of hospice care.     Thank you for the opportunity to participate in this patient's care.     Domenic Moras, BSN, RN [facility] Liaison   [phone] (24 h on call)

## 2020-08-28 ENCOUNTER — Encounter (HOSPITAL_COMMUNITY): Payer: Self-pay | Admitting: Internal Medicine

## 2020-08-28 DIAGNOSIS — L538 Other specified erythematous conditions: Secondary | ICD-10-CM | POA: Diagnosis not present

## 2020-08-28 DIAGNOSIS — Z79899 Other long term (current) drug therapy: Secondary | ICD-10-CM | POA: Diagnosis not present

## 2020-08-28 DIAGNOSIS — Z66 Do not resuscitate: Secondary | ICD-10-CM | POA: Diagnosis present

## 2020-08-28 DIAGNOSIS — Z87891 Personal history of nicotine dependence: Secondary | ICD-10-CM | POA: Diagnosis not present

## 2020-08-28 DIAGNOSIS — Z515 Encounter for palliative care: Secondary | ICD-10-CM | POA: Diagnosis not present

## 2020-08-28 DIAGNOSIS — I4819 Other persistent atrial fibrillation: Secondary | ICD-10-CM | POA: Diagnosis present

## 2020-08-28 DIAGNOSIS — I350 Nonrheumatic aortic (valve) stenosis: Secondary | ICD-10-CM | POA: Diagnosis present

## 2020-08-28 DIAGNOSIS — C799 Secondary malignant neoplasm of unspecified site: Secondary | ICD-10-CM | POA: Diagnosis present

## 2020-08-28 DIAGNOSIS — E222 Syndrome of inappropriate secretion of antidiuretic hormone: Secondary | ICD-10-CM | POA: Diagnosis present

## 2020-08-28 DIAGNOSIS — L03116 Cellulitis of left lower limb: Secondary | ICD-10-CM | POA: Diagnosis present

## 2020-08-28 DIAGNOSIS — I110 Hypertensive heart disease with heart failure: Secondary | ICD-10-CM | POA: Diagnosis present

## 2020-08-28 DIAGNOSIS — G9341 Metabolic encephalopathy: Secondary | ICD-10-CM | POA: Diagnosis present

## 2020-08-28 DIAGNOSIS — I5022 Chronic systolic (congestive) heart failure: Secondary | ICD-10-CM

## 2020-08-28 DIAGNOSIS — C61 Malignant neoplasm of prostate: Secondary | ICD-10-CM | POA: Diagnosis present

## 2020-08-28 DIAGNOSIS — M728 Other fibroblastic disorders: Secondary | ICD-10-CM | POA: Diagnosis present

## 2020-08-28 DIAGNOSIS — I447 Left bundle-branch block, unspecified: Secondary | ICD-10-CM | POA: Diagnosis present

## 2020-08-28 DIAGNOSIS — D509 Iron deficiency anemia, unspecified: Secondary | ICD-10-CM | POA: Diagnosis present

## 2020-08-28 DIAGNOSIS — R609 Edema, unspecified: Secondary | ICD-10-CM | POA: Diagnosis not present

## 2020-08-28 DIAGNOSIS — E43 Unspecified severe protein-calorie malnutrition: Secondary | ICD-10-CM | POA: Diagnosis present

## 2020-08-28 DIAGNOSIS — E871 Hypo-osmolality and hyponatremia: Secondary | ICD-10-CM | POA: Diagnosis present

## 2020-08-28 DIAGNOSIS — I428 Other cardiomyopathies: Secondary | ICD-10-CM | POA: Diagnosis present

## 2020-08-28 DIAGNOSIS — Z883 Allergy status to other anti-infective agents status: Secondary | ICD-10-CM | POA: Diagnosis not present

## 2020-08-28 DIAGNOSIS — Z7901 Long term (current) use of anticoagulants: Secondary | ICD-10-CM | POA: Diagnosis not present

## 2020-08-28 DIAGNOSIS — D519 Vitamin B12 deficiency anemia, unspecified: Secondary | ICD-10-CM | POA: Diagnosis present

## 2020-08-28 DIAGNOSIS — C8330 Diffuse large B-cell lymphoma, unspecified site: Secondary | ICD-10-CM | POA: Diagnosis present

## 2020-08-28 DIAGNOSIS — Z20822 Contact with and (suspected) exposure to covid-19: Secondary | ICD-10-CM | POA: Diagnosis present

## 2020-08-28 DIAGNOSIS — L0390 Cellulitis, unspecified: Secondary | ICD-10-CM | POA: Diagnosis not present

## 2020-08-28 DIAGNOSIS — G4730 Sleep apnea, unspecified: Secondary | ICD-10-CM | POA: Diagnosis present

## 2020-08-28 HISTORY — DX: Chronic systolic (congestive) heart failure: I50.22

## 2020-08-28 LAB — FOLATE: Folate: 10.2 ng/mL (ref >5.9)

## 2020-08-28 LAB — TYPE AND SCREEN
ABO/RH(D): A POS
Antibody Screen: NEGATIVE

## 2020-08-28 LAB — CBC
HCT: 25.3 % — ABNORMAL LOW (ref 39.0–52.0)
Hemoglobin: 8.1 g/dL — ABNORMAL LOW (ref 13.0–17.0)
MCH: 29.7 pg (ref 26.0–34.0)
MCHC: 32 g/dL (ref 30.0–36.0)
MCV: 92.7 fL (ref 80.0–100.0)
Platelets: 280 10*3/uL (ref 150–400)
RBC: 2.73 MIL/uL — ABNORMAL LOW (ref 4.22–5.81)
RDW: 14.7 % (ref 11.5–15.5)
WBC: 6.1 10*3/uL (ref 4.0–10.5)
nRBC: 0 % (ref 0.0–0.2)

## 2020-08-28 LAB — IRON AND TIBC
Iron: 18 ug/dL — ABNORMAL LOW (ref 45–182)
Saturation Ratios: 10 % — ABNORMAL LOW (ref 17.9–39.5)
TIBC: 181 ug/dL — ABNORMAL LOW (ref 250–450)
UIBC: 163 ug/dL

## 2020-08-28 LAB — VITAMIN B12: Vitamin B-12: 191 pg/mL (ref 180–914)

## 2020-08-28 LAB — HEMOGLOBIN A1C
Hgb A1c MFr Bld: 5.8 % — ABNORMAL HIGH (ref 4.8–5.6)
Mean Plasma Glucose: 119.76 mg/dL

## 2020-08-28 LAB — BASIC METABOLIC PANEL
Anion gap: 7 (ref 5–15)
BUN: 17 mg/dL (ref 8–23)
CO2: 24 mmol/L (ref 22–32)
Calcium: 8.7 mg/dL — ABNORMAL LOW (ref 8.9–10.3)
Chloride: 96 mmol/L — ABNORMAL LOW (ref 98–111)
Creatinine, Ser: 0.67 mg/dL (ref 0.61–1.24)
GFR, Estimated: >60 mL/min (ref >60)
Glucose, Bld: 153 mg/dL — ABNORMAL HIGH (ref 70–99)
Potassium: 4.1 mmol/L (ref 3.5–5.1)
Sodium: 127 mmol/L — ABNORMAL LOW (ref 135–145)

## 2020-08-28 LAB — SODIUM: Sodium: 130 mmol/L — ABNORMAL LOW (ref 135–145)

## 2020-08-28 LAB — SARS CORONAVIRUS 2 (TAT 6-24 HRS): SARS Coronavirus 2: NEGATIVE

## 2020-08-28 LAB — RETICULOCYTES
Immature Retic Fract: 17.7 % — ABNORMAL HIGH (ref 2.3–15.9)
RBC.: 2.81 MIL/uL — ABNORMAL LOW (ref 4.22–5.81)
Retic Count, Absolute: 44.4 10*3/uL (ref 19.0–186.0)
Retic Ct Pct: 1.6 % (ref 0.4–3.1)

## 2020-08-28 LAB — LACTIC ACID, PLASMA: Lactic Acid, Venous: 0.9 mmol/L (ref 0.5–1.9)

## 2020-08-28 LAB — FERRITIN: Ferritin: 783 ng/mL — ABNORMAL HIGH (ref 24–336)

## 2020-08-28 MED ORDER — ACETAMINOPHEN 325 MG PO TABS
650.0000 mg | ORAL_TABLET | Freq: Four times a day (QID) | ORAL | Status: DC | PRN
  Administered 2020-08-28: 650 mg via ORAL
  Filled 2020-08-28: qty 2

## 2020-08-28 MED ORDER — SODIUM CHLORIDE 0.9 % IV SOLN: INTRAVENOUS | Status: DC

## 2020-08-28 MED ORDER — DEXTROSE-NACL 5-0.9 % IV SOLN: INTRAVENOUS | Status: DC

## 2020-08-28 MED ORDER — ONDANSETRON HCL 4 MG PO TABS: 4.0000 mg | ORAL_TABLET | Freq: Four times a day (QID) | ORAL | Status: DC | PRN

## 2020-08-28 MED ORDER — SODIUM CHLORIDE 0.9 % IV SOLN
1.0000 g | Freq: Every day | INTRAVENOUS | Status: DC
  Administered 2020-08-28 – 2020-08-31 (×4): 1 g via INTRAVENOUS
  Filled 2020-08-28 (×4): qty 10

## 2020-08-28 MED ORDER — CALCIUM CARBONATE ANTACID 500 MG PO CHEW
4.0000 | CHEWABLE_TABLET | Freq: Every day | ORAL | Status: DC
  Administered 2020-08-28 – 2020-09-02 (×5): 800 mg via ORAL
  Filled 2020-08-28 (×6): qty 4

## 2020-08-28 MED ORDER — ACETAMINOPHEN 650 MG RE SUPP: 650.0000 mg | Freq: Four times a day (QID) | RECTAL | Status: DC | PRN

## 2020-08-28 MED ORDER — CARVEDILOL 6.25 MG PO TABS
6.2500 mg | ORAL_TABLET | Freq: Two times a day (BID) | ORAL | Status: DC
  Administered 2020-08-28 – 2020-09-03 (×13): 6.25 mg via ORAL
  Filled 2020-08-28 (×6): qty 1
  Filled 2020-08-28: qty 2
  Filled 2020-08-28 (×7): qty 1

## 2020-08-28 MED ORDER — SACUBITRIL-VALSARTAN 49-51 MG PO TABS
1.0000 | ORAL_TABLET | Freq: Every day | ORAL | Status: DC
  Administered 2020-08-28 – 2020-08-31 (×5): 1 via ORAL
  Filled 2020-08-28 (×5): qty 1

## 2020-08-28 MED ORDER — LACTATED RINGERS IV BOLUS
1000.0000 mL | Freq: Once | INTRAVENOUS | Status: AC
  Administered 2020-08-28: 1000 mL via INTRAVENOUS

## 2020-08-28 MED ORDER — APIXABAN 5 MG PO TABS
5.0000 mg | ORAL_TABLET | Freq: Two times a day (BID) | ORAL | Status: DC
  Administered 2020-08-28 – 2020-09-03 (×14): 5 mg via ORAL
  Filled 2020-08-28 (×14): qty 1

## 2020-08-28 MED ORDER — VANCOMYCIN HCL 1500 MG/300ML IV SOLN
1500.0000 mg | Freq: Once | INTRAVENOUS | Status: AC
  Administered 2020-08-28: 1500 mg via INTRAVENOUS
  Filled 2020-08-28: qty 300

## 2020-08-28 MED ORDER — ONDANSETRON HCL 4 MG/2ML IJ SOLN: 4.0000 mg | Freq: Four times a day (QID) | INTRAMUSCULAR | Status: DC | PRN

## 2020-08-28 MED ORDER — NUTRITIONAL SUPPLEMENT PO LIQD: 120.0000 mL | Freq: Two times a day (BID) | ORAL | Status: DC

## 2020-08-28 NOTE — ED Notes (Signed)
The [pt has had pain and redness in his lt thigh going down sl into his lt knee area redness and pain increasing for the past 2 days regardless of the antibiotics hes taking  Alert and oriented
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
## 2020-08-28 NOTE — ED Notes (Signed)
Sitting up in bed eating lunch

## 2020-08-28 NOTE — ED Notes (Signed)
Phone report called to Sam RN, all questions answered.
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
## 2020-08-28 NOTE — Progress Notes (Signed)
Patrick [name] ED 01- [facility]) Hospitalized Hospice Pt visit  Patrick [name] is a current hospice patient with [facility], admitted 07/09/20 with a terminal diagnosis of large B cell lymphoma.  Pt has been assessed in his home multiple times since 08/19/20 for redness, pain  and heat to L thigh with a more pronounced palpable mass. Pt has been on Eliquis 5mg  BID since prior to admission.   Pt has completed 7 days of Keflex 500 mg TID with no improvement.  Doxycycline 100 mg BID was ordered but not begun;  pt elected to see primary care for evaluation and was sent to [facility] ED. Patrick [name] was admitted for cellulitis. Per Dr. Konrad Dolores with [facility] this is a related hospital admission.  Made a telephonic visit to patient who reports pain is less at this time.  Pt denies new or unmet needs.  Discussed that when he is discharged home [facility] on call will be available to make a visit; pt verbalized understanding and appreciation.  Report exchanged with hospital team.  No new concerns or needs at this time.  Voicemail left for pt's wife with contact info for [facility] liaison team.  This patient is appropriate for general inpatient status du to the need for ongoing IV therapies.  Vital Signs: 96/63 (74), 73 HR, 16 RR, 96% RA  I&O's: 1400/-  Abnormal Labs:  Na 127, Cl 96, Ca 8.7, Albumin 2.4, tot prot 6.0, RBC 2.73, Hgb 8.1  Diagnostics: [place] pending, no other scans/films  IV/PRN Meds: rocephin 1g PIV daily; vancomycin 1500 mg PIV once; LR 1L bolus PIV once; D5-NS @50mL  PIV continuous infusion  Problem List:  . Cellulitis of L thigh . Severe protein calorie malnutrition . Hyponatremia . Normocytic anemia . Persistent afib . CHF . Metastatic prostate cancer . Diffuse large B cell lymphoma  Discharge Planning: home with hospice  Family Contact: Spouse Patrick [name], left voicemail with contact information  IDT: updated  Goals of Care: DNR, does want to medically optimize within the hospice plan of  care  Should pt need ambulance transfer at discharge please use [facility] as they contract this service with [facility] for our active hospice patients.  Thank you for the opportunity to participate in this pt's care.  Patrick [name], BSN, RN [employer] [phone] (24h on call)

## 2020-08-28 NOTE — ED Notes (Signed)
Dr Nevada Crane updated via chat regarding BP-BP 27-517 systolic when awake, 00-17 systolic when sleeping, denies pain, sleeping when not awakened. NO new orders received.

## 2020-08-28 NOTE — Progress Notes (Signed)
Patrick [name] is a 87 y.o. male with medical history significant for nonischemic cardiomyopathy, systolic dysfunction of left ventricle, hypertension, LBBB, left scapular fracture, moderate aortic stenosis, multiple left rib fractures, history of closed traumatic pneumothorax, right ulnar neuropathy, sleep apnea not on CPAP, chronic systolic heart failure who is coming to the emergency department due to progressively worse edema, erythema and tenderness to palpation of the medial left thigh area since last week despite being treated initially with cephalexin.  He was then started on doxycycline without any results.  He went to see his PCP today and was sent to the emergency department for further evaluation and treatment after his BP was 83/54 mmHg of the office.  He denies fever, chills, malaise, nausea, emesis or decreased appetite.  No rhinorrhea, sore throat, cough, wheezing or hemoptysis.  Denies chest pain, palpitations, dizziness, diaphoresis, PND, orthopnea or pitting edema of the lower extremities.  No abdominal pain, diarrhea, melena or hematochezia.  He occasionally gets constipated.  He denies dysuria, frequency or hematuria.  No polyuria, polydipsia, polyphagia or blurred vision.  Work-up revealed left thigh cellulitis, currently on broad-spectrum IV antibiotics Rocephin.  08/28/20: Patient was seen and examined at bedside in the ED.  Not in acute distress.  He is somnolent but easily arousable to voices.  Erythema noted in medial side of his left thigh.  Associated with edema, down to his ankle.  We will get a Doppler ultrasound to rule out DVT.  Patient has been started on gentle IV fluid hydration due to hypovolemic hyponatremia with serum sodium of 127.  We will repeat serum sodium.  Hospice patient [place].  Please refer to H&P dictated by my partner Dr. Olevia Bowens on 08/28/2020 for further details of the assessment and plan.

## 2020-08-28 NOTE — ED Notes (Signed)
Tele  Breakfast Ordered 

## 2020-08-28 NOTE — ED Notes (Signed)
Left medial thigh with redness, tenderness and swelling, redness marked earlier and has moved past marking to groin upper aspect of marking.

## 2020-08-29 ENCOUNTER — Inpatient Hospital Stay (HOSPITAL_COMMUNITY)

## 2020-08-29 DIAGNOSIS — L538 Other specified erythematous conditions: Secondary | ICD-10-CM

## 2020-08-29 DIAGNOSIS — L0390 Cellulitis, unspecified: Secondary | ICD-10-CM

## 2020-08-29 DIAGNOSIS — R609 Edema, unspecified: Secondary | ICD-10-CM

## 2020-08-29 LAB — MRSA PCR SCREENING: MRSA by PCR: NEGATIVE

## 2020-08-29 MED ORDER — FERROUS SULFATE 325 (65 FE) MG PO TABS
325.0000 mg | ORAL_TABLET | Freq: Every day | ORAL | Status: DC
  Administered 2020-08-30 – 2020-09-03 (×5): 325 mg via ORAL
  Filled 2020-08-29 (×5): qty 1

## 2020-08-29 MED ORDER — METHOCARBAMOL 500 MG PO TABS: 500.0000 mg | ORAL_TABLET | Freq: Three times a day (TID) | ORAL | Status: DC | PRN

## 2020-08-29 NOTE — Progress Notes (Signed)
PROGRESS NOTE  NATION CRADLE JIR:[id number] DOB: 1932-01-20 DOA: 08/27/2020 PCP: Patient, No Pcp Per  HPI/Recap of past 24 hours: Patrick [name] is a 87 y.o. male, hospice patient of [facility], with medical history significant for nonischemic cardiomyopathy, persistent A. fib on Eliquis, systolic dysfunction of left ventricle, hypertension, LBBB, left scapular fracture, moderate aortic stenosis, multiple left rib fractures, history of closed traumatic pneumothorax, right ulnar neuropathy, sleep apnea not on CPAP, chronic systolic heart failure who is coming to the emergency department due to progressively worsening edema, erythema and tenderness to the medial left thigh area since 1 week prior to presentation despite being treated initially with cephalexin outpatient.  He was then started on doxycycline without improvement.  He went to see his PCP on the day of presentation and was sent to the emergency department for further evaluation and treatment due to BP 83/54.  Work-up revealed left thigh cellulitis, on Rocephin.  Due to hypovolemic hyponatremia with serum sodium of 127 patient was started on IV fluid normal saline at 30 cc/h.  Serum sodium is uptrending, 130.  08/29/20: Patient was seen and examined at his bedside.  He is alert and oriented x4.  Much improved this morning from the day before.  He is currently on IV antibiotics and gentle IV fluid hydration.  Left medial upper thigh erythema is improving however edema is persistent.  He had a Doppler ultrasound done of the left lower extremity which showed no evidence of DVT however revealed phlebitis of the great saphenous vein.   Assessment/Plan: Principal Problem:   Cellulitis of left thigh Active Problems:   Prostate cancer metastatic to multiple sites ([place])   Normocytic anemia   Persistent atrial fibrillation (HCC)   Diffuse large B cell lymphoma (HCC)   Severe protein-calorie malnutrition (HCC)   Hyponatremia   Chronic systolic heart  failure (last EF 40-45% in Sept/2020)   Left lower extremity cellulitis, POA, failed outpatient therapy. Initially treated with Keflex then switched to doxycycline outpatient. Is currently on Rocephin Obtain MRSA screening He is currently afebrile, T-max 99 on 08/29/2020. No leukocytosis. Pain control in place as needed  Iron deficiency anemia Iron studies indicative of iron deficiency with saturation ratio obtained and iron level of 18 on 08/28/2020. Hemoglobin downtrending 8.1 from 8.7 He has no overt bleeding Start ferrous sulfate 325 mg daily  Persistent A. fib on Eliquis. Currently rate controlled on Coreg 6.25 mg twice daily Continue Eliquis for CVA prevention. Continue to monitor on telemetry  Resolved acute metabolic encephalopathy Very somnolent and minimally interactive yesterday Today he is back to his baseline alert and oriented x4.  Essential hypertension BP is currently at goal He is currently on Coreg 6.25 mg twice daily and Entresto 49-51 mg 1 tablet nightly. Continue to closely monitor vital signs.  Impaired glucose tolerance. Hemoglobin A1c 5.8 on 08/28/2020. Avoid hypoglycemia  Physical debility PT OT to assess Fall precautions.  Goals of care Patient is a hospice patient of [facility]. He is DNR.   Code Status: DNR.  Family Communication: None at bedside.  Disposition Plan: Likely return to SNF on 08/31/2020.   Consultants:  None.  Procedures:  None.  Antimicrobials:  Rocephin.  DVT prophylaxis: Eliquis.  Status is: Inpatient    Dispo:  Patient From: Other  Planned Disposition: Residential Hospice  Anticipated date of discharge 08/31/2020.  Medically stable for discharge: No, ongoing management of left lower extremity cellulitis.          Objective: Vitals:  08/28/20 1523 08/28/20 2100 08/29/20 0300 08/29/20 0847  BP: 114/72 104/64 108/66 110/65  Pulse: 69 70 63 74  Resp: 18 16 16 16   Temp: 98 F (36.7 C) 98 F (36.7 C)  98.6 F (37 C) 99 F (37.2 C)  TempSrc: Oral Oral Oral Oral  SpO2: 95% 98% 96% 94%  Weight:        Intake/Output Summary (Last 24 hours) at 08/29/2020 1553 Last data filed at 08/29/2020 0732 Gross per 24 hour  Intake 480 ml  Output 400 ml  Net 80 ml   Filed Weights   08/28/20 0000  Weight: 79 kg    Exam:  . General: 87 y.o. year-old male well developed well nourished in no acute distress.  Alert and oriented x3. . Cardiovascular: Regular rate and rhythm with no rubs or gallops.  No thyromegaly or JVD noted.   Marland Kitchen Respiratory: Clear to auscultation with no wheezes or rales. Good inspiratory effort. . Abdomen: Soft nontender nondistended with normal bowel sounds x4 quadrants. . Musculoskeletal: Left medial thigh erythematous, edematous and warm. . Skin: No ulcerative lesions noted or rashes . Psychiatry: Mood is appropriate for condition and setting   Data Reviewed: CBC: Recent Labs  Lab 08/27/20 1748 08/28/20 0334  WBC 6.4 6.1  NEUTROABS 5.0  --   HGB 8.7* 8.1*  HCT 27.3* 25.3*  MCV 92.2 92.7  PLT 309 169   Basic Metabolic Panel: Recent Labs  Lab 08/27/20 1748 08/28/20 0334 08/28/20 1540  NA 128* 127* 130*  K 4.0 4.1  --   CL 94* 96*  --   CO2 25 24  --   GLUCOSE 99 153*  --   BUN 20 17  --   CREATININE 0.76 0.67  --   CALCIUM 9.3 8.7*  --    GFR: Estimated Creatinine Clearance: 68.4 mL/min (by C-G formula based on SCr of 0.67 mg/dL). Liver Function Tests: Recent Labs  Lab 08/27/20 1748  AST 35  ALT 31  ALKPHOS 74  BILITOT 0.6  PROT 6.0*  ALBUMIN 2.4*   No results for input(s): LIPASE, AMYLASE in the last 168 hours. No results for input(s): AMMONIA in the last 168 hours. Coagulation Profile: No results for input(s): INR, PROTIME in the last 168 hours. Cardiac Enzymes: No results for input(s): CKTOTAL, CKMB, CKMBINDEX, TROPONINI in the last 168 hours. BNP (last 3 results) No results for input(s): PROBNP in the last 8760 hours. HbA1C: Recent  Labs    08/28/20 1540  HGBA1C 5.8*   CBG: No results for input(s): GLUCAP in the last 168 hours. Lipid Profile: No results for input(s): CHOL, HDL, LDLCALC, TRIG, CHOLHDL, LDLDIRECT in the last 72 hours. Thyroid Function Tests: No results for input(s): TSH, T4TOTAL, FREET4, T3FREE, THYROIDAB in the last 72 hours. Anemia Panel: Recent Labs    08/28/20 1540  VITAMINB12 191  FOLATE 10.2  FERRITIN 783*  TIBC 181*  IRON 18*  RETICCTPCT 1.6   Urine analysis:    Component Value Date/Time   COLORURINE YELLOW 05/20/2020 0149   APPEARANCEUR CLEAR 05/20/2020 0149   LABSPEC 1.014 05/20/2020 0149   PHURINE 5.0 05/20/2020 0149   GLUCOSEU NEGATIVE 05/20/2020 0149   HGBUR MODERATE (A) 05/20/2020 0149   BILIRUBINUR NEGATIVE 05/20/2020 0149   KETONESUR NEGATIVE 05/20/2020 0149   PROTEINUR NEGATIVE 05/20/2020 0149   NITRITE NEGATIVE 05/20/2020 0149   LEUKOCYTESUR NEGATIVE 05/20/2020 0149   Sepsis Labs: @LABRCNTIP (procalcitonin:4,lacticidven:4)  ) Recent Results (from the past 240 hour(s))  SARS CORONAVIRUS 2 (TAT  6-24 HRS) Nasopharyngeal Nasopharyngeal Swab     Status: None   Collection Time: 08/28/20  1:35 PM   Specimen: Nasopharyngeal Swab  Result Value Ref Range Status   SARS Coronavirus 2 NEGATIVE NEGATIVE Final    Comment: (NOTE) SARS-CoV-2 target nucleic acids are NOT DETECTED.  The SARS-CoV-2 RNA is generally detectable in upper and lower respiratory specimens during the acute phase of infection. Negative results do not preclude SARS-CoV-2 infection, do not rule out co-infections with other pathogens, and should not be used as the sole basis for treatment or other patient management decisions. Negative results must be combined with clinical observations, patient history, and epidemiological information. The expected result is Negative.  Fact Sheet for Patients: SugarRoll.be  Fact Sheet for Healthcare  Providers: https://www.woods-mathews.com/  This test is not yet approved or cleared by the [place] FDA and  has been authorized for detection and/or diagnosis of SARS-CoV-2 by FDA under an Emergency Use Authorization (EUA). This EUA will remain  in effect (meaning this test can be used) for the duration of the COVID-19 declaration under Se ction 564(b)(1) of the Act, 21 U.S.C. section 360bbb-3(b)(1), unless the authorization is terminated or revoked sooner.  Performed at [facility] [place] [address], [place], [place] [address]       Studies: VAS [place] LOWER EXTREMITY VENOUS (DVT)  Result Date: 08/29/2020  Lower Venous DVT Study Indications: Erythema, Edema, and cellulitis of thigh.  Limitations: Edema. Comparison Study: No prior study on file Performing Technologist: Sharion Dove RVS  Examination Guidelines: A complete evaluation includes B-mode imaging, spectral Doppler, color Doppler, and power Doppler as needed of all accessible portions of each vessel. Bilateral testing is considered an integral part of a complete examination. Limited examinations for reoccurring indications may be performed as noted. The reflux portion of the exam is performed with the patient in reverse Trendelenburg.  +-----+---------------+---------+-----------+----------+--------------+ RIGHTCompressibilityPhasicitySpontaneityPropertiesThrombus Aging +-----+---------------+---------+-----------+----------+--------------+ CFV  Full           Yes      Yes                                 +-----+---------------+---------+-----------+----------+--------------+   +---------+---------------+---------+-----------+----------+--------------+ LEFT     CompressibilityPhasicitySpontaneityPropertiesThrombus Aging +---------+---------------+---------+-----------+----------+--------------+ CFV      Full           Yes      Yes                                  +---------+---------------+---------+-----------+----------+--------------+ SFJ      Full                                                        +---------+---------------+---------+-----------+----------+--------------+ FV Prox  Full                                                        +---------+---------------+---------+-----------+----------+--------------+ FV Mid   Full                                                        +---------+---------------+---------+-----------+----------+--------------+  FV DistalFull                                                        +---------+---------------+---------+-----------+----------+--------------+ PFV      Full                                                        +---------+---------------+---------+-----------+----------+--------------+ POP      Full           Yes      Yes                                 +---------+---------------+---------+-----------+----------+--------------+ PTV      Full                                                        +---------+---------------+---------+-----------+----------+--------------+ PERO     Full                                                        +---------+---------------+---------+-----------+----------+--------------+ GSV      Partial        Yes      Yes                                 +---------+---------------+---------+-----------+----------+--------------+     Summary: RIGHT: - No evidence of common femoral vein obstruction.  LEFT: - There is no evidence of deep vein thrombosis in the lower extremity. - There is no evidence of superficial venous thrombosis.  - There appears to be phlebitis of the great saphenous vein. The vein does not fully compress in some areas and appears mildly thickened, however, Doppler flow is normal  *See table(s) above for measurements and observations.    Preliminary     Scheduled Meds: . apixaban  5 mg Oral BID  .  calcium carbonate  4 tablet Oral Daily  . carvedilol  6.25 mg Oral BID WC  . sacubitril-valsartan  1 tablet Oral QHS    Continuous Infusions: . sodium chloride 30 mL/hr at 08/28/20 1950  . cefTRIAXone (ROCEPHIN)  IV 1 g (08/29/20 0524)     LOS: 1 day     Kayleen Memos, MD [facility] Pager [phone]  If 7PM-7AM, please contact night-coverage [email or web address] Password [facility] 08/29/2020, 3:53 PM

## 2020-08-29 NOTE — Plan of Care (Signed)

## 2020-08-29 NOTE — Progress Notes (Signed)
[place] [id number] - Manufacturing engineer [facility]) Hospitalized Hospice Pt visit  Patrick [name] is a current hospice patient with [facility], admitted 07/09/20 with a terminal diagnosis of large B cell lymphoma.  Pt has been assessed in his home multiple times since 08/19/20 for redness, pain  and heat to L thigh with a more pronounced palpable mass. Pt has been on Eliquis 5mg  BID since prior to admission.   Pt has completed 7 days of Keflex 500 mg TID with no improvement.  Doxycycline 100 mg BID was ordered but not begun;  pt elected to see primary care for evaluation and was sent to [facility] ED. Mr. Hyams was admitted for cellulitis. Per Dr. Konrad Dolores with [facility] this is a related hospital admission.  Made a telephonic visit to patient who reported feeling better, shared that his family had just visited and that was a source of comfort for him.  Pt denies new or unmet needs.  Discussed that when he is discharged home [facility] on call will be available to make a visit; pt verbalized understanding and appreciation.  Report exchanged with hospital team.  No new concerns or needs at this time.    Vital Signs: 99 oral, 110/65 (78), 74 HR, 16 RR, 94% RA  I&O's: not charted  Abnormal Labs:  Na 130  Diagnostics: Ultrasound LLE: Summary:  RIGHT:  - No evidence of common femoral vein obstruction.     LEFT:  - There is no evidence of deep vein thrombosis in the lower extremity.  - There is no evidence of superficial venous thrombosis.     - There appears to be phlebitis of the great saphenous vein. The vein does  not fully compress in some areas and appears mildly thickened, however,  Doppler flow is normal IV/PRN Meds: rocephin 1g PIV daily, Tylenol and Zofran ordered PRN, no doses given Problem List:  . Cellulitis of L thigh . Severe protein calorie malnutrition . Hyponatremia . Normocytic anemia . Persistent afib . CHF . Metastatic prostate cancer . Diffuse large B cell lymphoma  Discharge Planning: home with  hospice  Family Contact: Spouse Jannette, left voicemail with contact information  IDT: updated  Goals of Care: DNR, does want to medically optimize within the hospice plan of care  Should pt need ambulance transfer at discharge please use [facility] as they contract this service with [facility] for our active hospice patients.  Thank you for the opportunity to participate in this pt's care.  Domenic Moras, BSN, RN [employer] [phone] (24h on call)

## 2020-08-29 NOTE — Progress Notes (Signed)
VASCULAR LAB    Left lower extremity venous duplex has been performed.  See CV proc for preliminary results.   Elven Laboy, RVT 08/29/2020, 2:54 PM

## 2020-08-30 ENCOUNTER — Inpatient Hospital Stay (HOSPITAL_COMMUNITY)

## 2020-08-30 LAB — CBC
HCT: 24.4 % — ABNORMAL LOW (ref 39.0–52.0)
Hemoglobin: 8.3 g/dL — ABNORMAL LOW (ref 13.0–17.0)
MCH: 30.3 pg (ref 26.0–34.0)
MCHC: 34 g/dL (ref 30.0–36.0)
MCV: 89.1 fL (ref 80.0–100.0)
Platelets: 317 10*3/uL (ref 150–400)
RBC: 2.74 MIL/uL — ABNORMAL LOW (ref 4.22–5.81)
RDW: 14.8 % (ref 11.5–15.5)
WBC: 9.7 10*3/uL (ref 4.0–10.5)
nRBC: 0 % (ref 0.0–0.2)

## 2020-08-30 LAB — BASIC METABOLIC PANEL
Anion gap: 10 (ref 5–15)
BUN: 12 mg/dL (ref 8–23)
CO2: 20 mmol/L — ABNORMAL LOW (ref 22–32)
Calcium: 8.3 mg/dL — ABNORMAL LOW (ref 8.9–10.3)
Chloride: 96 mmol/L — ABNORMAL LOW (ref 98–111)
Creatinine, Ser: 0.67 mg/dL (ref 0.61–1.24)
GFR, Estimated: >60 mL/min (ref >60)
Glucose, Bld: 94 mg/dL (ref 70–99)
Potassium: 3.8 mmol/L (ref 3.5–5.1)
Sodium: 126 mmol/L — ABNORMAL LOW (ref 135–145)

## 2020-08-30 MED ORDER — GADOBUTROL 1 MMOL/ML IV SOLN
7.0000 mL | Freq: Once | INTRAVENOUS | Status: AC | PRN
  Administered 2020-08-30: 7 mL via INTRAVENOUS

## 2020-08-30 MED ORDER — SODIUM CHLORIDE 0.9 % IV SOLN: INTRAVENOUS | Status: AC

## 2020-08-30 NOTE — Progress Notes (Addendum)
[place] [id number] - Manufacturing engineer [facility]) Hospitalized Hospice Pt visit  Patrick [name] is a currenthospicepatient with [facility], admitted 07/09/20 with a terminal diagnosis of large B cell lymphoma.Pt has been assessed in his home multiple times since 08/19/20 for redness, pain and heat to L thigh with a more pronounced palpable mass. Pt has been on Eliquis 5mg  BID since prior to admission.Pt has completed 7 days of Keflex 500 mg TID with no improvement. Doxycycline 100 mg BID was ordered but not begun;  pt elected to see primary care for evaluation and was sent to [facility] ED. Patrick [name] was admitted for cellulitis. Per Dr. Konrad Dolores with [facility] this is a related [name] admission.  Visited patient at bedside. Alert and oriented x4. Stated he had a MRI today due to no improvement from IV Antibiotics. Pt denies new or unmet needs.  Discussed that when he is discharged home Patrick [name] on call will be available to make a visit; pt verbalized understanding and appreciation. Report exchanged with [name] team.  No new concerns or needs at this time.    Patient is still appropriate for GIP at this time, left medial side of his thigh is erythematous, edematous and indurated, induration is not improving.  We will obtain an MRI of this left thigh to further assess. IV antibiotics.  Vital Signs: 98.9 oral, 143/77, 87 HR, 17 RR, 98% RA  I&O's: ?  Abnormal Labs:   Na 126 (L) Chl 96 (L) CO2 20(L) Ca 8.3 (L) RBC 2.74 (L) Hgb 8.3 (L) HCT 24.4 (L)   Diagnostics: Ultrasound LLE: Summary:  RIGHT:  - No evidence of common femoral vein obstruction.    LEFT:  - There is no evidence of deep vein thrombosis in the lower extremity.  - There is no evidence of superficial venous thrombosis.    - There appears to be phlebitis of the great saphenous vein. The vein does  not fully compress in some areas and appears mildly thickened, however,  Doppler flow is normal IV/PRN Meds: rocephin 1g IV daily, 0.9%  sodium chloride 95ml/hr Tylenol and Zofran ordered PRN, no doses given Problem List:   Cellulitis of L thigh  Severe protein calorie malnutrition  Hyponatremia  Normocytic anemia  Persistent afib  CHF  Metastatic prostate cancer  Diffuse large B cell lymphoma  Discharge Planning: home with hospice  Family Contact: Spouse Patrick [name] is deaf, spouse updated by patient via text   IDT: updated  Goals of Care: DNR, does want to medically optimize within the hospice plan of care  Should pt need ambulance transfer at discharge please use [facility] as they contract this service with [facility] for our active hospice patients.  Thank you for the opportunity to participate in this pt's care.  Clementeen Hoof, BSN, [employer] [phone]

## 2020-08-30 NOTE — Progress Notes (Signed)
PROGRESS NOTE  Patrick [name] OEU:[id number] DOB: 19-Sep-1931 DOA: 08/27/2020 PCP: Patient, No Pcp Per  HPI/Recap of past 24 hours: Patrick [name] is a 87 y.o. male, hospice patient of Patrick [name], with medical history significant for nonischemic cardiomyopathy, persistent A. fib on Eliquis, systolic dysfunction of left ventricle, hypertension, LBBB, left scapular fracture, moderate aortic stenosis, multiple left rib fractures, history of closed traumatic pneumothorax, right ulnar neuropathy, sleep apnea not on CPAP, chronic systolic heart failure who is coming to the emergency department due to progressively worsening edema, erythema and tenderness to the medial left thigh area since 1 week prior to presentation despite being treated initially with cephalexin outpatient.  He was then started on doxycycline without improvement.  He went to see his PCP on the day of presentation and was sent to the emergency department for further evaluation and treatment due to BP 83/54.  Work-up revealed left thigh cellulitis, on Rocephin.  Due to hypovolemic hyponatremia with serum sodium of 127 patient was started on IV fluid normal saline at 30 cc/h.    He had a Doppler ultrasound done of the left lower extremity which showed no evidence of DVT however revealed phlebitis of the great saphenous vein.  08/30/20: Patient was seen and examined at bedside.  He denies any pain at the time of this visit.  Left medial side of his thigh is erythematous, edematous and indurated, induration is not improving.  We will obtain an MRI of this left thigh to further assess.   Assessment/Plan: Principal Problem:   Cellulitis of left thigh Active Problems:   Prostate cancer metastatic to multiple sites ([place])   Normocytic anemia   Persistent atrial fibrillation (HCC)   Diffuse large B cell lymphoma (HCC)   Severe protein-calorie malnutrition (HCC)   Hyponatremia   Chronic systolic heart failure (last EF 40-45% in  Sept/2020)   Left lower extremity cellulitis, POA, failed outpatient therapy. Initially treated with Keflex then switched to doxycycline outpatient. Is currently on Rocephin MRSA screening was negative. He is currently afebrile, T-max 99 on 08/29/2020. No leukocytosis. Pain control in place as needed Left medial side of his thigh is erythematous, edematous and indurated, induration is not improving.  We will obtain an MRI of this left thigh to further assess. Follow MRI results.  Iron deficiency anemia Iron studies indicative of iron deficiency with saturation ratio obtained and iron level of 18 on 08/28/2020. Hemoglobin is uptrending 8.3 from 8.1. He has no overt bleeding Continue ferrous sulfate 325 mg daily  Persistent A. fib on Eliquis. Currently rate controlled on Coreg 6.25 mg twice daily Continue Eliquis for CVA prevention. Continue to monitor on telemetry  Resolved acute metabolic encephalopathy Currently alert and oriented x4.  Essential hypertension BP is currently at goal He is currently on Coreg 6.25 mg twice daily and Entresto 49-51 mg 1 tablet nightly. Continue to closely monitor vital signs.  Impaired glucose tolerance. Hemoglobin A1c 5.8 on 08/28/2020. Avoid hypoglycemia  Physical debility PT OT consult placed. Fall precautions.  Goals of care Patient is a hospice patient of Patrick [name]. He is DNR.   Code Status: DNR.  Family Communication: None at bedside.  Disposition Plan: Likely return to SNF on 09/01/2020.  Pending improvement of symptomatology.   Consultants:  None.  Procedures:  None.  Antimicrobials:  Rocephin.  DVT prophylaxis: Eliquis.  Status is: Inpatient    Dispo:  Patient From: Home  Planned Disposition: Residential Hospice  Anticipated date of discharge 09/01/2020.  Medically stable for discharge:  No, ongoing management of left lower extremity cellulitis.          Objective: Vitals:   08/29/20 1300 08/29/20 2030  08/30/20 0540 08/30/20 0727  BP: 112/68 (!) 186/38 129/82 (!) 143/77  Pulse:  66 97 87  Resp:  16 18 17   Temp: 97.9 F (36.6 C) 98.1 F (36.7 C) (!) 97.4 F (36.3 C) 98.9 F (37.2 C)  TempSrc: Oral Oral Oral Oral  SpO2: 95% 99% 96% 98%  Weight:        Intake/Output Summary (Last 24 hours) at 08/30/2020 1537 Last data filed at 08/30/2020 0515 Gross per 24 hour  Intake 925.95 ml  Output 300 ml  Net 625.95 ml   Filed Weights   08/28/20 0000  Weight: 79 kg    Exam:  . General: 87 y.o. year-old male chronically ill-appearing no acute distress.  He is alert and oriented x3.   . Cardiovascular: Regular rate and rhythm no rubs or gallops.   Marland Kitchen Respiratory: Clear to auscultation no wheezes or rales.   . Abdomen: Soft nontender normal bowel sounds present . musculoskeletal: Left medial thigh erythematous, edematous, warm and indurated. . Skin: Erythema, edema, warmth as noted above.   Marland Kitchen Psychiatry: Mood is appropriate for condition and setting.   Data Reviewed: CBC: Recent Labs  Lab 08/27/20 1748 08/28/20 0334 08/30/20 0240  WBC 6.4 6.1 9.7  NEUTROABS 5.0  --   --   HGB 8.7* 8.1* 8.3*  HCT 27.3* 25.3* 24.4*  MCV 92.2 92.7 89.1  PLT 309 280 709   Basic Metabolic Panel: Recent Labs  Lab 08/27/20 1748 08/28/20 0334 08/28/20 1540 08/30/20 0240  NA 128* 127* 130* 126*  K 4.0 4.1  --  3.8  CL 94* 96*  --  96*  CO2 25 24  --  20*  GLUCOSE 99 153*  --  94  BUN 20 17  --  12  CREATININE 0.76 0.67  --  0.67  CALCIUM 9.3 8.7*  --  8.3*   GFR: Estimated Creatinine Clearance: 68.4 mL/min (by C-G formula based on SCr of 0.67 mg/dL). Liver Function Tests: Recent Labs  Lab 08/27/20 1748  AST 35  ALT 31  ALKPHOS 74  BILITOT 0.6  PROT 6.0*  ALBUMIN 2.4*   No results for input(s): LIPASE, AMYLASE in the last 168 hours. No results for input(s): AMMONIA in the last 168 hours. Coagulation Profile: No results for input(s): INR, PROTIME in the last 168 hours. Cardiac  Enzymes: No results for input(s): CKTOTAL, CKMB, CKMBINDEX, TROPONINI in the last 168 hours. BNP (last 3 results) No results for input(s): PROBNP in the last 8760 hours. HbA1C: Recent Labs    08/28/20 1540  HGBA1C 5.8*   CBG: No results for input(s): GLUCAP in the last 168 hours. Lipid Profile: No results for input(s): CHOL, HDL, LDLCALC, TRIG, CHOLHDL, LDLDIRECT in the last 72 hours. Thyroid Function Tests: No results for input(s): TSH, T4TOTAL, FREET4, T3FREE, THYROIDAB in the last 72 hours. Anemia Panel: Recent Labs    08/28/20 1540  VITAMINB12 191  FOLATE 10.2  FERRITIN 783*  TIBC 181*  IRON 18*  RETICCTPCT 1.6   Urine analysis:    Component Value Date/Time   COLORURINE YELLOW 05/20/2020 0149   APPEARANCEUR CLEAR 05/20/2020 0149   LABSPEC 1.014 05/20/2020 0149   PHURINE 5.0 05/20/2020 0149   GLUCOSEU NEGATIVE 05/20/2020 0149   HGBUR MODERATE (A) 05/20/2020 0149   BILIRUBINUR NEGATIVE 05/20/2020 0149   KETONESUR NEGATIVE 05/20/2020 0149  PROTEINUR NEGATIVE 05/20/2020 0149   NITRITE NEGATIVE 05/20/2020 0149   LEUKOCYTESUR NEGATIVE 05/20/2020 0149   Sepsis Labs: @LABRCNTIP (procalcitonin:4,lacticidven:4)  ) Recent Results (from the past 240 hour(s))  SARS CORONAVIRUS 2 (TAT 6-24 HRS) Nasopharyngeal Nasopharyngeal Swab     Status: None   Collection Time: 08/28/20  1:35 PM   Specimen: Nasopharyngeal Swab  Result Value Ref Range Status   SARS Coronavirus 2 NEGATIVE NEGATIVE Final    Comment: (NOTE) SARS-CoV-2 target nucleic acids are NOT DETECTED.  The SARS-CoV-2 RNA is generally detectable in upper and lower respiratory specimens during the acute phase of infection. Negative results do not preclude SARS-CoV-2 infection, do not rule out co-infections with other pathogens, and should not be used as the sole basis for treatment or other patient management decisions. Negative results must be combined with clinical observations, patient history, and  epidemiological information. The expected result is Negative.  Fact Sheet for Patients: SugarRoll.be  Fact Sheet for Healthcare Providers: https://www.woods-mathews.com/  This test is not yet approved or cleared by the [place] FDA and  has been authorized for detection and/or diagnosis of SARS-CoV-2 by FDA under an Emergency Use Authorization (EUA). This EUA will remain  in effect (meaning this test can be used) for the duration of the COVID-19 declaration under Se ction 564(b)(1) of the Act, 21 U.S.C. section 360bbb-3(b)(1), unless the authorization is terminated or revoked sooner.  Performed at [facility], [place] [address] [place], [place] [address]   MRSA PCR Screening     Status: None   Collection Time: 08/29/20  7:52 PM   Specimen: Nasal Mucosa; Nasopharyngeal  Result Value Ref Range Status   MRSA by PCR NEGATIVE NEGATIVE Final    Comment:        The GeneXpert MRSA Assay (FDA approved for NASAL specimens only), is one component of a comprehensive MRSA colonization surveillance program. It is not intended to diagnose MRSA infection nor to guide or monitor treatment for MRSA infections. Performed at [facility], [place] [address], [place], [place] [address]       Studies: No results found.  Scheduled Meds: . apixaban  5 mg Oral BID  . calcium carbonate  4 tablet Oral Daily  . carvedilol  6.25 mg Oral BID WC  . ferrous sulfate  325 mg Oral Q breakfast  . sacubitril-valsartan  1 tablet Oral QHS    Continuous Infusions: . sodium chloride 50 mL/hr at 08/30/20 0705  . cefTRIAXone (ROCEPHIN)  IV 1 g (08/30/20 0531)     LOS: 2 days     Kayleen Memos, MD [facility] Pager [phone]  If 7PM-7AM, please contact night-coverage [email or web address] Password [email or web address] 08/30/2020, 3:37 PM

## 2020-08-31 ENCOUNTER — Telehealth: Payer: Self-pay

## 2020-08-31 MED ORDER — SODIUM CHLORIDE 0.9 % IV SOLN
1.0000 g | Freq: Once | INTRAVENOUS | Status: AC
  Administered 2020-08-31: 1 g via INTRAVENOUS
  Filled 2020-08-31: qty 10

## 2020-08-31 MED ORDER — SODIUM CHLORIDE 0.9 % IV SOLN
2.0000 g | Freq: Every day | INTRAVENOUS | Status: DC
  Administered 2020-09-01 – 2020-09-02 (×2): 2 g via INTRAVENOUS
  Filled 2020-08-31 (×2): qty 20

## 2020-08-31 MED ORDER — SODIUM CHLORIDE 1 G PO TABS
1.0000 g | ORAL_TABLET | Freq: Two times a day (BID) | ORAL | Status: DC
  Filled 2020-08-31 (×2): qty 1

## 2020-08-31 NOTE — Progress Notes (Signed)
Patrick [name], materials [facility]) Hospitalized Hospice Pt visit  Patrick [name] is a currenthospicepatient with [facility], admitted 07/09/20 with a terminal diagnosis of large B cell lymphoma.Pt has been assessed in his home multiple times since 08/19/20 for redness, pain and heat to L thigh with a more pronounced palpable mass. Pt has been on Eliquis 5mg  BID since prior to admission.Pt has completed 7 days of Keflex 500 mg TID with no improvement. Doxycycline 100 mg BID was ordered but not begun; pt elected to see primary care for evaluation and was sent to [facility] ED. Mr. Gasior was admitted for cellulitis. Per Dr. Konrad Dolores with [facility] this is a related hospital admission.  Visited patient at bedside. Alert and oriented x4. Patient stated he was starting to feel better but his leg was still swelling. Reported no pain. Discussed that when he is discharged home [employer] on call will be available to make a visit; pt verbalized understanding and appreciation. Report exchanged with hospital team. No new concerns or needs at this time.   Patient is still appropriate for GIP at this time, left medial side of his thigh is erythematous, edematous and indurated, induration is not improving. cefTRIAXone (ROCEPHIN) 1 g in sodium chloride 0.9 % 100 mL IVPB  MRI: IMPRESSION: 1. Diffuse subcutaneous soft tissue swelling/edema/fluid possibly reflecting cellulitis. 2. No discrete rim enhancing fluid collection to suggest an abscess. 3. Diffuse patchy Myofasciitis without definite findings for pyomyositis. 4. Patchy abnormal marrow signal in both femurs with subsequent areas of enhancement could be related to lymphoma or prostate cancer.   Electronically Signed   By: Marijo Sanes M.D.   On: 08/30/2020 16:02   Vital Signs: 99.9 oral, 125/98, 90 HR, 17 RR, 98% RA  I&O's: ?/260   Abnormal Labs: Na 126 (L) Chl 96 (L) CO2 20(L) Ca 8.3 (L) RBC 2.74 (L) Hgb 8.3 (L) HCT 24.4  (L)   Diagnostics: Ultrasound LLE: Summary:  RIGHT:  - No evidence of common femoral vein obstruction.   LEFT:  - There is no evidence of deep vein thrombosis in the lower extremity.  - There is no evidence of superficial venous thrombosis.   - There appears to be phlebitis of the great saphenous vein. The vein does  not fully compress in some areas and appears mildly thickened, however,  Doppler flow is normal IV/PRN Meds: rocephin 1g IV daily, 0.9% sodium chloride 79ml/hr Tylenol and Zofran ordered PRN, no doses given Problem List:   Cellulitis of L thigh  Severe protein calorie malnutrition  Hyponatremia  Normocytic anemia  Persistent afib  CHF  Metastatic prostate cancer  Diffuse large B cell lymphoma  Discharge Planning: home with hospice possibly Thursday or Friday  Family Contact: Spouse Patrick [name] is deaf, spouse updated by patient via text   IDT: updated  Goals of Care: DNR, does want to medically optimize within the hospice plan of care  Should pt need ambulance transfer at discharge please use [facility] as they contract this service with [facility] for our active hospice patients.  Thank you for the opportunity to participate in this pt's care.  Clementeen Hoof, BSN, [employer] [phone]

## 2020-08-31 NOTE — Progress Notes (Signed)
PROGRESS NOTE  [name] DOB: [birth year] DOA: 08/27/2020 PCP: Patrick [name], No Pcp Per  HPI/Recap of past 24 hours: Patrick [name] is a 87 y.o. male, hospice Patrick [name] of [place], with medical history significant for nonischemic cardiomyopathy, persistent A. fib on Eliquis, systolic dysfunction of left ventricle, hypertension, LBBB, left scapular fracture, moderate aortic stenosis, multiple left rib fractures, history of closed traumatic pneumothorax, right ulnar neuropathy, sleep apnea not on CPAP, chronic systolic heart failure who is coming to the emergency department from home due to progressively worsening edema, erythema and tenderness to the medial left thigh area since 1 week prior to presentation despite being treated initially with cephalexin outpatient.  He was then started on doxycycline without improvement.  He went to see his PCP on the day of presentation and was sent to the emergency department for further evaluation and treatment due to BP 83/54.  Work-up revealed left thigh cellulitis, on Rocephin.  Due to hypovolemic hyponatremia with serum sodium of 127 Patrick [name] was started on IV fluid normal saline at 30 cc/h.    He had a Doppler ultrasound done of the left lower extremity which showed no evidence of DVT however revealed phlebitis of the great saphenous vein.  MRI of his left thigh done on 08/30/2020 showed diffuse subcutaneous soft tissue swelling/edema/fluid possibly reflecting cellulitis.  No evidence of abscess.  Diffuse patchy myofasciitis without definite findings for pyomyositis  08/31/20: Seen and examined at bedside.  He has no new complaints.  Currently on IV antibiotics for cellulitis.  Assessment/Plan: Principal Problem:   Cellulitis of left thigh Active Problems:   Prostate cancer metastatic to multiple sites ([place])   Normocytic anemia   Persistent atrial fibrillation (HCC)   Diffuse large B cell lymphoma (HCC)   Severe protein-calorie malnutrition  (HCC)   Hyponatremia   Chronic systolic heart failure (last EF 40-45% in Sept/2020)   Left lower extremity cellulitis, POA, failed outpatient therapy. Initially treated with Keflex then switched to doxycycline outpatient. Is currently on Rocephin MRSA screening was negative. MRI of his left thigh done on 08/30/2020 showed diffuse subcutaneous soft tissue swelling/edema/fluid possibly reflecting cellulitis.  No evidence of abscess.  Diffuse patchy myofasciitis without definite findings for pyomyositis He is currently afebrile, T-max 99 on 08/29/2020. No leukocytosis. Pain control in place as needed  Hypovolemic hyponatremia Serum sodium 126 Continue normal saline at 50 cc/h x 1 day Add salt tablet 1 g twice daily  Iron deficiency anemia Iron studies indicative of iron deficiency with saturation ratio obtained and iron level of 18 on 08/28/2020. Hemoglobin is uptrending 8.3 from 8.1. He has no overt bleeding Continue ferrous sulfate 325 mg daily  Persistent A. fib on Eliquis. Currently rate controlled on Coreg 6.25 mg twice daily Continue Eliquis for CVA prevention. Continue to monitor on telemetry  Resolved acute metabolic encephalopathy Currently alert and oriented x4.  Essential hypertension BP is currently at goal He is currently on Coreg 6.25 mg twice daily and Entresto 49-51 mg 1 tablet nightly. Continue to closely monitor vital signs.  Impaired glucose tolerance. Hemoglobin A1c 5.8 on 08/28/2020. Avoid hypoglycemia  Physical debility Seen by PT OT, no further PT OT recommended Fall precautions.  Prostate cancer with metastasis. Currently under hospice care.  Goals of care Patrick [name] is a hospice Patrick [name] of [place]. He is DNR.   Code Status: DNR.  Family Communication: None at bedside.  Disposition Plan: Likely return to home with hospice care on 09/02/2020.  Pending improvement of  symptomatology.  Consultants:  None.  Procedures:  None.  Antimicrobials:  Rocephin.  DVT prophylaxis: Eliquis.  Status is: Inpatient    Dispo:  Patrick [name] From: Home  Planned Disposition: Home Hospice  Anticipated date of discharge 09/02/2020.  Medically stable for discharge: No, ongoing management of left lower extremity cellulitis.          Objective: Vitals:   08/30/20 1936 08/31/20 0440 08/31/20 0739 08/31/20 1615  BP: (!) 108/54 (!) 119/59 (!) 125/98 125/66  Pulse: 68 91 90 71  Resp: 16 15 17 17   Temp: 98.8 F (37.1 C) (!) 100.5 F (38.1 C) 99.9 F (37.7 C) 98.6 F (37 C)  TempSrc: Oral Oral Oral Oral  SpO2: 97% 95% 97% 98%  Weight:        Intake/Output Summary (Last 24 hours) at 08/31/2020 1748 Last data filed at 08/31/2020 1000 Gross per 24 hour  Intake --  Output 260 ml  Net -260 ml   Filed Weights   08/28/20 0000  Weight: 79 kg    Exam:  . General: 87 y.o. year-old male frail-appearing no acute distress.  He is alert oriented x3.   . Cardiovascular: Regular rate and rhythm no rubs or gallops. Marland Kitchen Respiratory: Clear to auscultation no wheeze or rales. . Abdomen: Soft nontender normal bowel sounds present. . musculoskeletal: Left medial thigh erythematous, edematous, warm and indurated. . Skin: Erythema edema, warmth noted left medial thigh. . Psychiatry: Mood is appropriate for condition setting.  Data Reviewed: CBC: Recent Labs  Lab 08/27/20 1748 08/28/20 0334 08/30/20 0240  WBC 6.4 6.1 9.7  NEUTROABS 5.0  --   --   HGB 8.7* 8.1* 8.3*  HCT 27.3* 25.3* 24.4*  MCV 92.2 92.7 89.1  PLT 309 280 063   Basic Metabolic Panel: Recent Labs  Lab 08/27/20 1748 08/28/20 0334 08/28/20 1540 08/30/20 0240  NA 128* 127* 130* 126*  K 4.0 4.1  --  3.8  CL 94* 96*  --  96*  CO2 25 24  --  20*  GLUCOSE 99 153*  --  94  BUN 20 17  --  12  CREATININE 0.76 0.67  --  0.67  CALCIUM 9.3 8.7*  --  8.3*   GFR: Estimated Creatinine Clearance:  68.4 mL/min (by C-G formula based on SCr of 0.67 mg/dL). Liver Function Tests: Recent Labs  Lab 08/27/20 1748  AST 35  ALT 31  ALKPHOS 74  BILITOT 0.6  PROT 6.0*  ALBUMIN 2.4*   No results for input(s): LIPASE, AMYLASE in the last 168 hours. No results for input(s): AMMONIA in the last 168 hours. Coagulation Profile: No results for input(s): INR, PROTIME in the last 168 hours. Cardiac Enzymes: No results for input(s): CKTOTAL, CKMB, CKMBINDEX, TROPONINI in the last 168 hours. BNP (last 3 results) No results for input(s): PROBNP in the last 8760 hours. HbA1C: No results for input(s): HGBA1C in the last 72 hours. CBG: No results for input(s): GLUCAP in the last 168 hours. Lipid Profile: No results for input(s): CHOL, HDL, LDLCALC, TRIG, CHOLHDL, LDLDIRECT in the last 72 hours. Thyroid Function Tests: No results for input(s): TSH, T4TOTAL, FREET4, T3FREE, THYROIDAB in the last 72 hours. Anemia Panel: No results for input(s): VITAMINB12, FOLATE, FERRITIN, TIBC, IRON, RETICCTPCT in the last 72 hours. Urine analysis:    Component Value Date/Time   COLORURINE YELLOW 05/20/2020 0149   APPEARANCEUR CLEAR 05/20/2020 0149   LABSPEC 1.014 05/20/2020 0149   PHURINE 5.0 05/20/2020 0149   GLUCOSEU NEGATIVE 05/20/2020 0149  HGBUR MODERATE (A) 05/20/2020 0149   BILIRUBINUR NEGATIVE 05/20/2020 0149   KETONESUR NEGATIVE 05/20/2020 0149   PROTEINUR NEGATIVE 05/20/2020 0149   NITRITE NEGATIVE 05/20/2020 0149   LEUKOCYTESUR NEGATIVE 05/20/2020 0149   Sepsis Labs: @LABRCNTIP (procalcitonin:4,lacticidven:4)  ) Recent Results (from the past 240 hour(s))  SARS CORONAVIRUS 2 (TAT 6-24 HRS) Nasopharyngeal Nasopharyngeal Swab     Status: None   Collection Time: 08/28/20  1:35 PM   Specimen: Nasopharyngeal Swab  Result Value Ref Range Status   SARS Coronavirus 2 NEGATIVE NEGATIVE Final    Comment: (NOTE) SARS-CoV-2 target nucleic acids are NOT DETECTED.  The SARS-CoV-2 RNA is generally  detectable in upper and lower respiratory specimens during the acute phase of infection. Negative results do not preclude SARS-CoV-2 infection, do not rule out co-infections with other pathogens, and should not be used as the sole basis for treatment or other Patrick [name] management decisions. Negative results must be combined with clinical observations, Patrick [name] history, and epidemiological information. The expected result is Negative.  Fact Sheet for Patients: SugarRoll.be  Fact Sheet for Healthcare Providers: https://www.woods-mathews.com/  This test is not yet approved or cleared by the [place] FDA and  has been authorized for detection and/or diagnosis of SARS-CoV-2 by FDA under an Emergency Use Authorization (EUA). This EUA will remain  in effect (meaning this test can be used) for the duration of the COVID-19 declaration under Se ction 564(b)(1) of the Act, 21 U.S.C. section 360bbb-3(b)(1), unless the authorization is terminated or revoked sooner.  Performed at [facility], [place] [address], [place], [place] [address]   MRSA PCR Screening     Status: None   Collection Time: 08/29/20  7:52 PM   Specimen: Nasal Mucosa; Nasopharyngeal  Result Value Ref Range Status   MRSA by PCR NEGATIVE NEGATIVE Final    Comment:        The GeneXpert MRSA Assay (FDA approved for NASAL specimens only), is one component of a comprehensive MRSA colonization surveillance program. It is not intended to diagnose MRSA infection nor to guide or monitor treatment for MRSA infections. Performed at [facility], [place] [address] [place], [place] [address]       Studies: No results found.  Scheduled Meds: . apixaban  5 mg Oral BID  . calcium carbonate  4 tablet Oral Daily  . carvedilol  6.25 mg Oral BID WC  . ferrous sulfate  325 mg Oral Q breakfast  . sacubitril-valsartan  1 tablet Oral QHS    Continuous Infusions: . sodium chloride  50 mL/hr at 08/30/20 0705  . cefTRIAXone (ROCEPHIN)  IV    . [START ON 09/01/2020] cefTRIAXone (ROCEPHIN)  IV       LOS: 3 days     Kayleen Memos, MD [facility] Pager [phone]  If 7PM-7AM, please contact night-coverage [email or web address] Password [facility] 08/31/2020, 5:48 PM

## 2020-08-31 NOTE — Telephone Encounter (Signed)
Patient called office and left voicemail stating he will not be able to attend his scheduled lab and MD follow-up on 09/03/20. Patient is currently admitted to the hospital.  Patient stated he will call office to reschedule once he is discharged.

## 2020-08-31 NOTE — Evaluation (Signed)
Physical Therapy Evaluation Patient Details Name: Patrick [name] MRN: [id number] DOB: [birth year] Today's Date: 08/31/2020   History of Present Illness  Pt is an 87 yo male admitted on 3/4 with L thigh infection/cellulitis/swelling and erythemia. PMH:  nonischemic cardiomyopathy, persistent A. fib on Eliquis, systolic dysfunction of left ventricle, hypertension, LBBB, left scapular fracture, moderate aortic stenosis, multiple left rib fractures, history of closed traumatic pneumothorax, right ulnar neuropathy, sleep apnea not on CPAP, chronic systolic heart failure Pt is under hospice care at the home.    Clinical Impression  Pt denies pain in L LE and is functioning near baseline. Pt reports more "winded" than normal however was still able to function with min guard. Suspect pt will progress well enough to return home with spouse. Acute PT to cont to follow to maintain function and progress back to safe mod I.    Follow Up Recommendations No PT follow up;Supervision/Assistance - 24 hour (under hospice care)    Equipment Recommendations  None recommended by PT (has needed DME)    Recommendations for Other Services       Precautions / Restrictions Precautions Precautions: Fall Precaution Comments: L thigh swelling, errythemia, redness Restrictions Weight Bearing Restrictions: No      Mobility  Bed Mobility Overal bed mobility: Modified Independent             General bed mobility comments: HOB elevated, used bed rails to pull self up, no physical assist needed, pt has hospital bed at home    Transfers Overall transfer level: Needs assistance Equipment used: Rolling walker (2 wheeled) Transfers: Sit to/from Stand Sit to Stand: Min guard         General transfer comment: verbal cues for hand placement, min guard for safety due to first time up  Ambulation/Gait Ambulation/Gait assistance: Min guard Gait Distance (Feet): 60 Feet Assistive device: Rolling walker (2  wheeled) Gait Pattern/deviations: Step-through pattern;Decreased stride length;Trunk flexed Gait velocity: dec Gait velocity interpretation: 1.31 - 2.62 ft/sec, indicative of limited community ambulator General Gait Details: verbal cues to stay in walker and not push it to far in front of self, pt with SOB, SPO2 92% on RA. pt with standing rest break x 30 sec 1/2 way. Pt reports feeling more winded than usual  Stairs            Wheelchair Mobility    Modified Rankin (Stroke Patients Only)       Balance Overall balance assessment: Mild deficits observed, not formally tested (pt requires RW for safe amb, sitting balance is good)                                           Pertinent Vitals/Pain Pain Assessment: No/denies pain    Home Living Family/patient expects to be discharged to:: Private residence Living Arrangements: Spouse/significant other (hospice care, RN 1x/wk) Available Help at Discharge: Family;Available 24 hours/day Type of Home: House Home Access: Stairs to enter Entrance Stairs-Rails: Left;Right;Can reach both Entrance Stairs-Number of Steps: 4 to enter Home Layout: One level Home Equipment: Environmental consultant - 2 wheels;Shower seat;Grab bars - toilet;Grab bars - tub/shower;Hand held shower head;Wheelchair - manual;Hospital bed      Prior Function Level of Independence: Needs assistance   Gait / Transfers Assistance Needed: pt uses RW, amb short distances, uses hospital bed and shower seat in shower with hand held shower head  ADL's / Homemaking  Assistance Needed: per pt, pt does all ADLs independently        Hand Dominance   Dominant Hand: Right    Extremity/Trunk Assessment   Upper Extremity Assessment Upper Extremity Assessment: Overall WFL for tasks assessed    Lower Extremity Assessment Lower Extremity Assessment: LLE deficits/detail LLE Deficits / Details: generalized weakness due to L thigh swelling but able to ambulate with  buckling    Cervical / Trunk Assessment Cervical / Trunk Assessment: Normal  Communication   Communication: No difficulties  Cognition Arousal/Alertness: Awake/alert Behavior During Therapy: WFL for tasks assessed/performed Overall Cognitive Status: Within Functional Limits for tasks assessed                                        General Comments General comments (skin integrity, edema, etc.): L thigh red, swollen from medial thight to posterior/hamstring area    Exercises     Assessment/Plan    PT Assessment Patient needs continued PT services  PT Problem List Decreased strength;Decreased activity tolerance;Decreased balance;Decreased mobility       PT Treatment Interventions DME instruction;Gait training;Functional mobility training;Stair training;Therapeutic activities;Therapeutic exercise;Balance training;Neuromuscular re-education    PT Goals (Current goals can be found in the Care Plan section)  Acute Rehab PT Goals Patient Stated Goal: fix my leg and go home PT Goal Formulation: With patient Time For Goal Achievement: 09/14/20 Potential to Achieve Goals: Good    Frequency Min 3X/week   Barriers to discharge        Co-evaluation               AM-PAC PT "6 Clicks" Mobility  Outcome Measure Help needed turning from your back to your side while in a flat bed without using bedrails?: None Help needed moving from lying on your back to sitting on the side of a flat bed without using bedrails?: None Help needed moving to and from a bed to a chair (including a wheelchair)?: A Little Help needed standing up from a chair using your arms (e.g., wheelchair or bedside chair)?: A Little Help needed to walk in hospital room?: A Little Help needed climbing 3-5 steps with a railing? : A Little 6 Click Score: 20    End of Session Equipment Utilized During Treatment: Gait belt Activity Tolerance: Patient tolerated treatment well Patient left: in  chair;with call bell/phone within reach;with chair alarm set Nurse Communication: Mobility status PT Visit Diagnosis: Unsteadiness on feet (R26.81);Muscle weakness (generalized) (M62.81);Difficulty in walking, not elsewhere classified (R26.2);Other symptoms and signs involving the nervous system (R29.898)    Time: 7169-6789 PT Time Calculation (min) (ACUTE ONLY): 26 min   Charges:   PT Evaluation $PT Eval Moderate Complexity: 1 Mod PT Treatments $Gait Training: 8-22 mins        Kittie Plater, PT, DPT Acute Rehabilitation Services Pager #: [phone] Office #: [phone]   Berline Lopes 08/31/2020, 10:31 AM

## 2020-08-31 NOTE — Evaluation (Signed)
Occupational Therapy Evaluation Patient Details Name: Patrick [name] MRN: [id number] DOB: 14-Mar-1932 Today's Date: 08/31/2020    History of Present Illness Pt is an 87 yo male admitted on 3/4 with L thigh infection/cellulitis/swelling and erythemia. PMH:  nonischemic cardiomyopathy, persistent A. fib on Eliquis, systolic dysfunction of left ventricle, hypertension, LBBB, left scapular fracture, moderate aortic stenosis, multiple left rib fractures, history of closed traumatic pneumothorax, right ulnar neuropathy, sleep apnea not on CPAP, chronic systolic heart failure Pt is under hospice care at the home.   Clinical Impression   Pt presents with decline in function and safety with ADLs and ADL mobility with impaired strength, balance and endurance. PTA, pt lived at home with his wife and was Ind with ADLs/selfcare and used a RW for mobility. Pt currently requires min guard A with LB ADLS, grooming standing at sink and for transfers/mobility using RW. Pt would benefit from acute OT services to address impairments to maximize level of function and safety    Follow Up Recommendations  No OT follow up    Equipment Recommendations  None recommended by OT (pt has all necessary DME and A/E at home)    Recommendations for Other Services       Precautions / Restrictions Precautions Precautions: Fall Precaution Comments: L thigh swelling, errythemia, redness Restrictions Weight Bearing Restrictions: No      Mobility Bed Mobility               General bed mobility comments: pt in recliner upon arrival    Transfers Overall transfer level: Needs assistance Equipment used: Rolling walker (2 wheeled) Transfers: Sit to/from Stand Sit to Stand: Min guard         General transfer comment: verbal cues for hand placement, min guard for safety    Balance Overall balance assessment: Mild deficits observed, not formally tested                                          ADL either performed or assessed with clinical judgement   ADL Overall ADL's : Needs assistance/impaired Eating/Feeding: Independent;Sitting   Grooming: Wash/dry hands;Wash/dry face;Min guard;Standing   Upper Body Bathing: Independent;Sitting   Lower Body Bathing: Min guard   Upper Body Dressing : Set up;Independent;Sitting   Lower Body Dressing: Min guard   Toilet Transfer: Min guard;Ambulation;RW;Comfort height toilet;Grab bars;Cueing for safety   Toileting- Clothing Manipulation and Hygiene: Min guard;Sit to/from stand       Functional mobility during ADLs: Min guard;Rolling walker;Cueing for safety       Vision Baseline Vision/History: Wears glasses Wears Glasses: At all times Patient Visual Report: No change from baseline       Perception     Praxis      Pertinent Vitals/Pain Pain Assessment: No/denies pain     Hand Dominance Right   Extremity/Trunk Assessment Upper Extremity Assessment Upper Extremity Assessment: Overall WFL for tasks assessed   Lower Extremity Assessment Lower Extremity Assessment: Defer to PT evaluation   Cervical / Trunk Assessment Cervical / Trunk Assessment: Normal   Communication Communication Communication: No difficulties   Cognition Arousal/Alertness: Awake/alert Behavior During Therapy: WFL for tasks assessed/performed Overall Cognitive Status: Within Functional Limits for tasks assessed  General Comments       Exercises     Shoulder Instructions      Home Living Family/patient expects to be discharged to:: Private residence Living Arrangements: Spouse/significant other Available Help at Discharge: Family;Available 24 hours/day Type of Home: House Home Access: Stairs to enter [employer] of Steps: 4 Entrance Stairs-Rails: Left;Right;Can reach both Home Layout: One level     Bathroom Shower/Tub: Occupational psychologist: Standard      Home Equipment: Environmental consultant - 2 wheels;Shower seat;Grab bars - toilet;Grab bars - tub/shower;Hand held shower head;Wheelchair - manual;Hospital bed          Prior Functioning/Environment Level of Independence: Needs assistance  Gait / Transfers Assistance Needed: pt uses RW, amb short distances, uses hospital bed ADL's / Homemaking Assistance Needed: per pt, pt does all ADLs independently, uses shower seat in shower with hand held shower head            OT Problem List: Decreased strength;Impaired balance (sitting and/or standing);Decreased knowledge of use of DME or AE      OT Treatment/Interventions: Self-care/ADL training;Patient/family education;Balance training;Therapeutic activities    OT Goals(Current goals can be found in the care plan section) Acute Rehab OT Goals Patient Stated Goal: fix my leg and go home OT Goal Formulation: With patient Time For Goal Achievement: 09/14/20 Potential to Achieve Goals: Good ADL Goals Pt Will Perform Grooming: with supervision;with modified independence;standing Pt Will Perform Lower Body Bathing: with supervision;with modified independence;sit to/from stand Pt Will Perform Lower Body Dressing: with supervision;with modified independence;sit to/from stand Pt Will Transfer to Toilet: with supervision;with modified independence;ambulating Pt Will Perform Toileting - Clothing Manipulation and hygiene: with supervision;with modified independence;sit to/from stand  OT Frequency: Min 2X/week   Barriers to D/C:            Co-evaluation              AM-PAC OT "6 Clicks" Daily Activity     Outcome Measure Help from another person eating meals?: None Help from another person taking care of personal grooming?: A Little Help from another person toileting, which includes using toliet, bedpan, or urinal?: A Little Help from another person bathing (including washing, rinsing, drying)?: A Little Help from another person to put on and taking  off regular upper body clothing?: None Help from another person to put on and taking off regular lower body clothing?: A Little 6 Click Score: 20   End of Session Equipment Utilized During Treatment: Gait belt;Rolling walker  Activity Tolerance: Patient tolerated treatment well Patient left: in chair;with call bell/phone within reach  OT Visit Diagnosis: Unsteadiness on feet (R26.81);Other symptoms and signs involving the nervous system (R29.898);Muscle weakness (generalized) (M62.81)                Time: 8295-6213 OT Time Calculation (min): 28 min Charges:  OT General Charges $OT Visit: 1 Visit OT Evaluation $OT Eval Moderate Complexity: 1 Mod OT Treatments $Self Care/Home Management : 8-22 mins    Emmit Alexanders [facility] 08/31/2020, 1:07 PM

## 2020-08-31 NOTE — Plan of Care (Signed)

## 2020-09-01 LAB — CBC
HCT: 21.2 % — ABNORMAL LOW (ref 39.0–52.0)
HCT: 24.3 % — ABNORMAL LOW (ref 39.0–52.0)
Hemoglobin: 7.1 g/dL — ABNORMAL LOW (ref 13.0–17.0)
Hemoglobin: 7.8 g/dL — ABNORMAL LOW (ref 13.0–17.0)
MCH: 30.1 pg (ref 26.0–34.0)
MCH: 29.1 pg (ref 26.0–34.0)
MCHC: 33.5 g/dL (ref 30.0–36.0)
MCHC: 32.1 g/dL (ref 30.0–36.0)
MCV: 89.8 fL (ref 80.0–100.0)
MCV: 90.7 fL (ref 80.0–100.0)
Platelets: 271 10*3/uL (ref 150–400)
Platelets: 302 10*3/uL (ref 150–400)
RBC: 2.36 MIL/uL — ABNORMAL LOW (ref 4.22–5.81)
RBC: 2.68 MIL/uL — ABNORMAL LOW (ref 4.22–5.81)
RDW: 14.7 % (ref 11.5–15.5)
RDW: 14.9 % (ref 11.5–15.5)
WBC: 7.3 10*3/uL (ref 4.0–10.5)
WBC: 5 10*3/uL (ref 4.0–10.5)
nRBC: 0 % (ref 0.0–0.2)
nRBC: 0 % (ref 0.0–0.2)

## 2020-09-01 LAB — BASIC METABOLIC PANEL
Anion gap: 8 (ref 5–15)
Anion gap: 6 (ref 5–15)
BUN: 11 mg/dL (ref 8–23)
BUN: 13 mg/dL (ref 8–23)
CO2: 20 mmol/L — ABNORMAL LOW (ref 22–32)
CO2: 22 mmol/L (ref 22–32)
Calcium: 8.2 mg/dL — ABNORMAL LOW (ref 8.9–10.3)
Calcium: 8.3 mg/dL — ABNORMAL LOW (ref 8.9–10.3)
Chloride: 99 mmol/L (ref 98–111)
Chloride: 99 mmol/L (ref 98–111)
Creatinine, Ser: 0.65 mg/dL (ref 0.61–1.24)
Creatinine, Ser: 0.64 mg/dL (ref 0.61–1.24)
GFR, Estimated: >60 mL/min (ref >60)
GFR, Estimated: >60 mL/min (ref >60)
Glucose, Bld: 104 mg/dL — ABNORMAL HIGH (ref 70–99)
Glucose, Bld: 131 mg/dL — ABNORMAL HIGH (ref 70–99)
Potassium: 3.8 mmol/L (ref 3.5–5.1)
Potassium: 3.8 mmol/L (ref 3.5–5.1)
Sodium: 127 mmol/L — ABNORMAL LOW (ref 135–145)
Sodium: 127 mmol/L — ABNORMAL LOW (ref 135–145)

## 2020-09-01 LAB — OSMOLALITY: Osmolality: 274 mOsm/kg — ABNORMAL LOW (ref 275–295)

## 2020-09-01 MED ORDER — SODIUM CHLORIDE 1 G PO TABS
2.0000 g | ORAL_TABLET | Freq: Three times a day (TID) | ORAL | Status: DC
  Administered 2020-09-01 – 2020-09-02 (×4): 2 g via ORAL
  Filled 2020-09-01 (×4): qty 2

## 2020-09-01 MED ORDER — ENSURE ENLIVE PO LIQD
237.0000 mL | Freq: Two times a day (BID) | ORAL | Status: DC
  Administered 2020-09-01: 237 mL via ORAL

## 2020-09-01 NOTE — Progress Notes (Signed)
Occupational Therapy Treatment Patient Details Name: Patrick [name] MRN: [id number] DOB: [birth year] Today's Date: 09/01/2020    History of present illness Pt is an 87 yo male admitted on 3/4 with L thigh infection/cellulitis/swelling and erythemia. PMH:  nonischemic cardiomyopathy, persistent A. fib on Eliquis, systolic dysfunction of left ventricle, hypertension, LBBB, left scapular fracture, moderate aortic stenosis, multiple left rib fractures, history of closed traumatic pneumothorax, right ulnar neuropathy, sleep apnea not on CPAP, chronic systolic heart failure Pt is under hospice care at the home.   OT comments  Pt making good progress with functional goals. OT will continue to follow acutely to maximize level of function and safety  Follow Up Recommendations  No OT follow up    Equipment Recommendations  None recommended by OT    Recommendations for Other Services      Precautions / Restrictions Precautions Precautions: Fall Precaution Comments: L thigh swelling, errythemia, redness Restrictions Weight Bearing Restrictions: No       Mobility Bed Mobility Overal bed mobility: Modified Independent Bed Mobility: Supine to Sit     Supine to sit: Modified independent (Device/Increase time)          Transfers Overall transfer level: Needs assistance Equipment used: Rolling walker (2 wheeled) Transfers: Sit to/from Stand Sit to Stand: Min guard         General transfer comment: verbal cues for hand placement, min guard for safety    Balance Overall balance assessment: Mild deficits observed, not formally tested                                         ADL either performed or assessed with clinical judgement   ADL Overall ADL's : Needs assistance/impaired     Grooming: Wash/dry hands;Wash/dry face;Standing;Set up;Supervision/safety       Lower Body Bathing: Min guard;Sit to/from stand       Lower Body Dressing: Min guard;Sit to/from  stand   Toilet Transfer: Min guard;Ambulation;RW;Comfort height toilet;Grab bars;Cueing for safety   Toileting- Clothing Manipulation and Hygiene: Supervision/safety       Functional mobility during ADLs: Min guard;Cueing for safety       Vision Baseline Vision/History: Wears glasses Wears Glasses: At all times Patient Visual Report: No change from baseline     Perception     Praxis      Cognition Arousal/Alertness: Awake/alert Behavior During Therapy: WFL for tasks assessed/performed Overall Cognitive Status: Within Functional Limits for tasks assessed                                          Exercises     Shoulder Instructions       General Comments      Pertinent Vitals/ Pain       Pain Assessment: No/denies pain  Home Living                                          Prior Functioning/Environment              Frequency  Min 2X/week        Progress Toward Goals  OT Goals(current goals can now be found in the care plan section)  Progress  towards OT goals: Progressing toward goals  Acute Rehab OT Goals Patient Stated Goal: fix my leg and go home  Plan Discharge plan remains appropriate    Co-evaluation                 AM-PAC OT "6 Clicks" Daily Activity     Outcome Measure   Help from another person eating meals?: None Help from another person taking care of personal grooming?: A Little Help from another person toileting, which includes using toliet, bedpan, or urinal?: A Little Help from another person bathing (including washing, rinsing, drying)?: A Little Help from another person to put on and taking off regular upper body clothing?: None Help from another person to put on and taking off regular lower body clothing?: A Little 6 Click Score: 20    End of Session Equipment Utilized During Treatment: Gait belt;Rolling walker  OT Visit Diagnosis: Unsteadiness on feet (R26.81);Other symptoms and  signs involving the nervous system (R29.898);Muscle weakness (generalized) (M62.81)   Activity Tolerance Patient tolerated treatment well   Patient Left in chair;with call bell/phone within reach;with nursing/sitter in room   Nurse Communication          Time: 5894-8347 OT Time Calculation (min): 20 min  Charges: OT General Charges $OT Visit: 1 Visit OT Treatments $Self Care/Home Management : 8-22 mins    Britt Bottom 09/01/2020, 1:57 PM

## 2020-09-01 NOTE — Progress Notes (Signed)
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 Patient has been on Eliquis 89m BID since prior to admission.Patient has completed 7 days of Keflex 500 mg TID with no improvement. Doxycycline 100 mg BID was ordered but not begun; pt elected to see primary care for evaluation [facility] Mr. AGrinewas admitted for cellulitis of left thigh. Per Dr. MKonrad Doloreswith ADietrichthis is Patrick related hospital admission.   Report exchanged, per attending plan to order infectious disease (ID) consultation to determine how to best treat his myofascitis. Met with patient at the bedside, he is alert and oriented and no complaints of pain at this time. Discussed what the recommendations of ID may be and how that would be best managed at home. If IV antibiotics are ordered, [name] would need assistance with the administration of this and would likely need the support of home health services. Patient states he would prefer to stop hospice to allow Medicare to pay for whatever home health services may be needed to treat his infection. Left upper thigh erythemic and warm to touch.   V/S: 98.5 oral, 112/69, HR 76, RR 17, SPO2 99% on RA I&O: 600/610 Labs: Na 127, RBC 2.36, hgb 7.1, HCT 21.2 Diagnostics: 3/7 MRI of left femur - Diffuse patchy Myofasciitis without definite findings for pyomyositis IVs/PRNs: rocephin 2 g IV QD  Problem List: - myofascitis - rocephin changed to 2g QD, left upper thigh erythemic, patient currently denying any pain, he is unable to bear weight on it, afebrile - hyponatremia - asymptomatic, alert and oriented, sodium tablet added - iron deficiency anemia - asymptomatic, no overt  signs of bleeding - metastatic prostate cancer - stable, no active treatments - large B cell lymphoma - stable, under the care of hospice and no active treatments  D/C planning: Patient is from home, he wants to d/c on Friday. If the recommendations are for long term IV abx therapy then he will stop hospice and need home health services started for him prior to d/c.  GOC: Clear, he does not want active treatment for his metastatic prostate cancer and lymphoma, however he does want the myofascitis treated so he can return home and be able to walk for what time he has left. Family: Patient is alert and oriented, his wife is extremely HOH and he texts her with updates. IDT: Hospice team updated.  JVenia CarbonRN, BSN, CVenango HospitalLiaison

## 2020-09-01 NOTE — Plan of Care (Signed)
  Problem: Health Behavior/Discharge Planning: Goal: Ability to manage health-related needs will improve Outcome: Progressing   Problem: Clinical Measurements: Goal: Will remain free from infection Outcome: Not Progressing

## 2020-09-01 NOTE — Progress Notes (Signed)
[facility] Progress Note  Patient: Patrick [name]    YSA:[id number]  DOA: 08/27/2020     Date of Service: the patient was seen and examined on 09/01/2020  Brief hospital course: Past medical history of large B-cell lymphoma and metastatic prostate cancer on hospice at home, A. fib chronic systolic CHF, HTN, LBBB, moderate aortic stenosis, OSA not on CPAP.  Presents with complaints of worsening swelling of his left thigh found to have severe cellulitis and pyomyositis. Currently plan is continue IV biotics monitor cultures.  Assessment and Plan: 1.  Left thigh cellulitis without abscess. Patchy mild fasciitis without pyomyositis. Failed outpatient therapy Treated with Keflex 500 mg 3 times daily outpatient.  Prescribed doxycycline but never started.  Presented due to worsening pain. Initially on IV Rocephin. MRSA PCR negative. Pain well controlled.  Induration not improved. Erythema appears to be improving based on initial pictures on admission. Lower extremity Doppler negative for any DVT. For now continue with IV antibiotics.  Will discuss with ID regarding further recommendation of longer-term antibiotics.  Should the patient require IV antibiotics for longer duration would like to come off of hospice.  2.  Hyponatremia Acute in nature.  Likely with volume overload.  Mild component of SIADH.  Currently asymptomatic. Baseline sodium 139.  Currently sodium 127.  Remained stable for last few days. We will continue to monitor and further work-up tomorrow.  3.  DLBCL Metastatic prostate cancer. Iron deficiency and B12 deficiency anemia with progressive worsening. Hemoglobin currently 7.1. We will recheck tomorrow. Currently no evidence of active bleeding. Baseline hemoglobin around 11. Reticulocyte count normal.  Iron level 18.  Folate level 10.  B12 199. We will initiate replacement.  No IV therapy given his acute infection. Patient currently does not want any treatment for his  malignancies and remains hospice appropriate.  4.  Persistent A. fib on Eliquis. HTN. Chronic systolic CHF Blood pressure stable.  Rate controlled. On Coreg 6.25 mg twice daily. On Eliquis given the anemia we will continue to closely monitor. Since blood pressure is normal I am currently holding Entresto. Left leg has swelling and edema which is likely associated with his infection rather than volume overload.  5.  Goals of care Patient remains DNR/DNI. Currently active with hospice. If he requires IV antibiotics he would like to switch off of hospice and transition to home with home health and palliative care. Does not want any therapy for his malignancies.  Diet: Regular diet DVT Prophylaxis:    apixaban (ELIQUIS) tablet 5 mg    Advance goals of care discussion: DNR  Family Communication: no family was present at bedside, at the time of interview.   Disposition:  Status is: Inpatient  Remains inpatient appropriate because: Need for IV antibiotics given his severity of infection  Dispo:  Patient From: Home  Planned Disposition: Home Hospice  Medically stable for discharge: No          Subjective: No nausea no vomiting.  Pain improving.  No fever no chills.  Continues to have swelling and redness and warmth in his left inner thigh area  Physical Exam:  General: Appear in mild distress, erythema and redness on the left inner thigh, no other rash; Oral Mucosa Clear, moist. no Abnormal Neck Mass Or lumps, Conjunctiva normal  Cardiovascular: S1 and S2 Present, no Murmur, Respiratory: good respiratory effort, Bilateral Air entry present and CTA, no Crackles, no wheezes Abdomen: Bowel Sound present, Soft and no tenderness Extremities: Left pedal edema Neurology: alert and  oriented to time, place, and person affect appropriate. no new focal deficit Gait not checked due to patient safety concerns  Vitals:   08/31/20 2001 09/01/20 0453 09/01/20 0722 09/01/20 1508  BP:  113/69 123/74 118/71 112/69  Pulse: 73 90 85 76  Resp: 15  18 17   Temp: 98.4 F (36.9 C) 99.2 F (37.3 C) 99.1 F (37.3 C) 98.5 F (36.9 C)  TempSrc: Oral Oral    SpO2: 98% 95% 95% 99%  Weight:        Intake/Output Summary (Last 24 hours) at 09/01/2020 1757 Last data filed at 09/01/2020 1700 Gross per 24 hour  Intake 1680 ml  Output 1250 ml  Net 430 ml   Filed Weights   08/28/20 0000  Weight: 79 kg    Data Reviewed: I have personally reviewed and interpreted daily labs, tele strips, imaging. I reviewed all nursing notes, pharmacy notes, vitals, pertinent old records I have discussed plan of care as described above with RN and patient/family.  CBC: Recent Labs  Lab 08/27/20 1748 08/28/20 0334 08/30/20 0240 09/01/20 0228  WBC 6.4 6.1 9.7 7.3  NEUTROABS 5.0  --   --   --   HGB 8.7* 8.1* 8.3* 7.1*  HCT 27.3* 25.3* 24.4* 21.2*  MCV 92.2 92.7 89.1 89.8  PLT 309 280 317 564   Basic Metabolic Panel: Recent Labs  Lab 08/27/20 1748 08/28/20 0334 08/28/20 1540 08/30/20 0240 09/01/20 0228  NA 128* 127* 130* 126* 127*  K 4.0 4.1  --  3.8 3.8  CL 94* 96*  --  96* 99  CO2 25 24  --  20* 20*  GLUCOSE 99 153*  --  94 104*  BUN 20 17  --  12 11  CREATININE 0.76 0.67  --  0.67 0.65  CALCIUM 9.3 8.7*  --  8.3* 8.2*    Studies: No results found.  Scheduled Meds: . apixaban  5 mg Oral BID  . calcium carbonate  4 tablet Oral Daily  . carvedilol  6.25 mg Oral BID WC  . feeding supplement  237 mL Oral BID BM  . ferrous sulfate  325 mg Oral Q breakfast  . sodium chloride  2 g Oral TID WC   Continuous Infusions: . cefTRIAXone (ROCEPHIN)  IV 2 g (09/01/20 0554)   PRN Meds: acetaminophen **OR** acetaminophen, methocarbamol, ondansetron **OR** ondansetron (ZOFRAN) IV  Time spent: 35 minutes  Author: Berle Mull, MD [facility] 09/01/2020 5:57 PM  To reach On-call, see care teams to locate the attending and reach out via [email or web address]. Between 7PM-7AM, please  contact night-coverage If you still have difficulty reaching the attending provider, please page the [facility] (Director on Call) for [facility] on amion for assistance.

## 2020-09-02 LAB — BASIC METABOLIC PANEL
Anion gap: 6 (ref 5–15)
BUN: 13 mg/dL (ref 8–23)
CO2: 25 mmol/L (ref 22–32)
Calcium: 8.2 mg/dL — ABNORMAL LOW (ref 8.9–10.3)
Chloride: 98 mmol/L (ref 98–111)
Creatinine, Ser: 0.61 mg/dL (ref 0.61–1.24)
GFR, Estimated: >60 mL/min (ref >60)
Glucose, Bld: 100 mg/dL — ABNORMAL HIGH (ref 70–99)
Potassium: 3 mmol/L — ABNORMAL LOW (ref 3.5–5.1)
Sodium: 129 mmol/L — ABNORMAL LOW (ref 135–145)

## 2020-09-02 LAB — CBC WITH DIFFERENTIAL/PLATELET
Abs Immature Granulocytes: 0.07 10*3/uL (ref 0.00–0.07)
Basophils Absolute: 0 10*3/uL (ref 0.0–0.1)
Basophils Relative: 0 %
Eosinophils Absolute: 0 10*3/uL (ref 0.0–0.5)
Eosinophils Relative: 0 %
HCT: 23.4 % — ABNORMAL LOW (ref 39.0–52.0)
Hemoglobin: 7.5 g/dL — ABNORMAL LOW (ref 13.0–17.0)
Immature Granulocytes: 1 %
Lymphocytes Relative: 5 %
Lymphs Abs: 0.4 10*3/uL — ABNORMAL LOW (ref 0.7–4.0)
MCH: 29 pg (ref 26.0–34.0)
MCHC: 32.1 g/dL (ref 30.0–36.0)
MCV: 90.3 fL (ref 80.0–100.0)
Monocytes Absolute: 0.6 10*3/uL (ref 0.1–1.0)
Monocytes Relative: 8 %
Neutro Abs: 6.4 10*3/uL (ref 1.7–7.7)
Neutrophils Relative %: 86 %
Platelets: 301 10*3/uL (ref 150–400)
RBC: 2.59 MIL/uL — ABNORMAL LOW (ref 4.22–5.81)
RDW: 14.8 % (ref 11.5–15.5)
WBC: 7.5 10*3/uL (ref 4.0–10.5)
nRBC: 0 % (ref 0.0–0.2)

## 2020-09-02 LAB — COMPREHENSIVE METABOLIC PANEL
ALT: 36 U/L (ref 0–44)
AST: 55 U/L — ABNORMAL HIGH (ref 15–41)
Albumin: 2 g/dL — ABNORMAL LOW (ref 3.5–5.0)
Alkaline Phosphatase: 63 U/L (ref 38–126)
Anion gap: 6 (ref 5–15)
BUN: 11 mg/dL (ref 8–23)
CO2: 24 mmol/L (ref 22–32)
Calcium: 8.3 mg/dL — ABNORMAL LOW (ref 8.9–10.3)
Chloride: 99 mmol/L (ref 98–111)
Creatinine, Ser: 0.62 mg/dL (ref 0.61–1.24)
GFR, Estimated: >60 mL/min (ref >60)
Glucose, Bld: 123 mg/dL — ABNORMAL HIGH (ref 70–99)
Potassium: 3.4 mmol/L — ABNORMAL LOW (ref 3.5–5.1)
Sodium: 129 mmol/L — ABNORMAL LOW (ref 135–145)
Total Bilirubin: 0.5 mg/dL (ref 0.3–1.2)
Total Protein: 4.9 g/dL — ABNORMAL LOW (ref 6.5–8.1)

## 2020-09-02 LAB — CK: Total CK: 22 U/L — ABNORMAL LOW (ref 49–397)

## 2020-09-02 LAB — C-REACTIVE PROTEIN: CRP: 17.5 mg/dL — ABNORMAL HIGH (ref <1.0)

## 2020-09-02 LAB — SEDIMENTATION RATE: Sed Rate: 75 mm/hr — ABNORMAL HIGH (ref 0–16)

## 2020-09-02 MED ORDER — POTASSIUM CHLORIDE CRYS ER 20 MEQ PO TBCR
40.0000 meq | EXTENDED_RELEASE_TABLET | ORAL | Status: AC
Start: 2020-09-02 — End: 2020-09-02
  Administered 2020-09-02: 40 meq via ORAL
  Filled 2020-09-02: qty 2

## 2020-09-02 MED ORDER — ENSURE ENLIVE PO LIQD
237.0000 mL | Freq: Three times a day (TID) | ORAL | Status: DC
  Administered 2020-09-02 (×2): 237 mL via ORAL

## 2020-09-02 MED ORDER — UREA 15 G PO PACK
15.0000 g | PACK | Freq: Two times a day (BID) | ORAL | Status: DC
  Administered 2020-09-02 – 2020-09-03 (×3): 15 g via ORAL
  Filled 2020-09-02 (×4): qty 1

## 2020-09-02 MED ORDER — FUROSEMIDE 10 MG/ML IJ SOLN
20.0000 mg | Freq: Once | INTRAMUSCULAR | Status: AC
  Administered 2020-09-02: 20 mg via INTRAVENOUS
  Filled 2020-09-02: qty 2

## 2020-09-02 MED ORDER — CEPHALEXIN 500 MG PO CAPS
500.0000 mg | ORAL_CAPSULE | Freq: Three times a day (TID) | ORAL | Status: DC
  Administered 2020-09-02 – 2020-09-03 (×3): 500 mg via ORAL
  Filled 2020-09-02 (×3): qty 1

## 2020-09-02 NOTE — Progress Notes (Addendum)
[facility] Progress Note  Patient: Patrick [name]    XBD:[id number]  DOA: 08/27/2020     Date of Service: the patient was seen and examined on 09/02/2020  Brief hospital course: Past medical history of large B-cell lymphoma and metastatic prostate cancer on hospice at home, A. fib chronic systolic CHF, HTN, LBBB, moderate aortic stenosis, OSA not on CPAP.  Presents with complaints of worsening swelling of his left thigh found to have severe cellulitis and pyomyositis. Currently plan is treat hyponatremia and continue antibiotics  Assessment and Plan: 1.  Left thigh cellulitis without abscess. Patchy mild fasciitis without pyomyositis. Failed outpatient therapy Treated with Keflex 500 mg 3 times daily outpatient.  Prescribed doxycycline but never started.  Presented due to worsening pain. Initially on IV Rocephin. MRSA PCR negative. Pain well controlled.  Induration not improved. Erythema appears to be improving based on initial pictures on admission. Lower extremity Doppler negative for any DVT. We will transition to oral Keflex 500 3 times daily likely will continue for 4 to 6 weeks.  2.  Hyponatremia hypervolemic/hypotonic Acute in nature.  Likely with volume overload.  Mild component of SIADH.  Currently asymptomatic. Baseline sodium 139.  At the lowest sodium 127.  Remained stable for last few days. Osmolarity also low. Changing sodium tablets to urea tablets. 1 dose of IV Lasix on 3/10.  3.  DLBCL Metastatic prostate cancer. Iron deficiency and B12 deficiency anemia with progressive worsening. Hemoglobin stable around 7. Currently no evidence of active bleeding. Baseline hemoglobin around 11. Reticulocyte count normal.  Iron level 18.  Folate level 10.  B12 199. We will initiate replacement.  No IV therapy given his acute infection. Patient currently does not want any treatment for his malignancies and remains hospice appropriate.  4.  Persistent A. fib on  Eliquis. HTN. Chronic systolic CHF Blood pressure stable.  Rate controlled. On Coreg 6.25 mg twice daily. On Eliquis given the anemia we will continue to closely monitor. Since blood pressure is normal I am currently holding Entresto. Left leg has swelling and edema which is likely associated with his infection rather than volume overload. 1 dose of IV Lasix on 3/10.  5.  Goals of care Patient remains DNR/DNI. Currently active with hospice. We will not be needing IV antibiotics for now therefore patient can continue his on hospice therapy. Does not want any therapy for his malignancies.  Diet: Regular diet DVT Prophylaxis:    apixaban (ELIQUIS) tablet 5 mg    Advance goals of care discussion: DNR  Family Communication: no family was present at bedside, at the time of interview.   Disposition:  Status is: Inpatient  Remains inpatient appropriate because: Need for IV antibiotics given his severity of infection  Dispo:  Patient From: Home  Planned Disposition: Home Hospice  Medically stable for discharge: No     Subjective: Fatigue and tired but no nausea no vomiting but no fever no chills.  Physical Exam:  General: Appear in mild distress, left eye erythema, no other rash; Oral Mucosa Clear, moist. no Abnormal Neck Mass Or lumps, Conjunctiva normal  Cardiovascular: S1 and S2 Present, no Murmur, Respiratory: good respiratory effort, Bilateral Air entry present and CTA, no Crackles, no wheezes Abdomen: Bowel Sound present, Soft and no tenderness Extremities: Left thigh edema with induration and redness pedal edema Neurology: alert and oriented to time, place, and person affect appropriate. no new focal deficit Gait not checked due to patient safety concerns    Vitals:  09/02/20 0450 09/02/20 0740 09/02/20 1214 09/02/20 1420  BP: 133/69 131/73 131/73 122/68  Pulse: 91 72 72 76  Resp: 16 17 17 17   Temp: 99.3 F (37.4 C) 99 F (37.2 C) 99 F (37.2 C) 98.6 F (37 C)   TempSrc: Oral Oral Oral Oral  SpO2: 94% 95%  97%  Weight:   79 kg   Height:   5\' 10"  (1.778 m)     Intake/Output Summary (Last 24 hours) at 09/02/2020 1810 Last data filed at 09/02/2020 1652 Gross per 24 hour  Intake 200 ml  Output 650 ml  Net -450 ml   Filed Weights   08/28/20 0000 09/02/20 1214  Weight: 79 kg 79 kg    Data Reviewed: I have personally reviewed and interpreted daily labs, tele strips, imaging. I reviewed all nursing notes, pharmacy notes, vitals, pertinent old records I have discussed plan of care as described above with RN and patient/family.  CBC: Recent Labs  Lab 08/27/20 1748 08/28/20 0334 08/30/20 0240 09/01/20 0228 09/01/20 1834 09/02/20 0859  WBC 6.4 6.1 9.7 7.3 5.0 7.5  NEUTROABS 5.0  --   --   --   --  6.4  HGB 8.7* 8.1* 8.3* 7.1* 7.8* 7.5*  HCT 27.3* 25.3* 24.4* 21.2* 24.3* 23.4*  MCV 92.2 92.7 89.1 89.8 90.7 90.3  PLT 309 280 317 271 302 466   Basic Metabolic Panel: Recent Labs  Lab 08/30/20 0240 09/01/20 0228 09/01/20 1834 09/02/20 0859 09/02/20 1613  NA 126* 127* 127* 129* 129*  K 3.8 3.8 3.8 3.4* 3.0*  CL 96* 99 99 99 98  CO2 20* 20* 22 24 25   GLUCOSE 94 104* 131* 123* 100*  BUN 12 11 13 11 13   CREATININE 0.67 0.65 0.64 0.62 0.61  CALCIUM 8.3* 8.2* 8.3* 8.3* 8.2*    Studies: No results found.  Scheduled Meds: . apixaban  5 mg Oral BID  . calcium carbonate  4 tablet Oral Daily  . carvedilol  6.25 mg Oral BID WC  . cephALEXin  500 mg Oral Q8H  . feeding supplement  237 mL Oral TID BM  . ferrous sulfate  325 mg Oral Q breakfast  . urea  15 g Oral BID   Continuous Infusions:  PRN Meds: acetaminophen **OR** acetaminophen, methocarbamol, ondansetron **OR** ondansetron (ZOFRAN) IV  Time spent: 35 minutes  Author: Berle Mull, MD [facility] 09/02/2020 6:10 PM  To reach On-call, see care teams to locate the attending and reach out via [email or web address]. Between 7PM-7AM, please contact night-coverage If you still  have difficulty reaching the attending provider, please page the [facility] (Director on Call) for [facility] on amion for assistance.

## 2020-09-02 NOTE — Progress Notes (Addendum)
[id number] - Manufacturing engineer [facility]) - Hospitalized Patient RN Note    Patrick [name] is a current hospice patient with [facility], admitted 07/09/20 with a terminal diagnosis of large B cell lymphoma.  Patient has been assessed in his home multiple times since 08/19/20 for redness, pain  and heat to left thigh with a more pronounced palpable mass. Patient has been on Eliquis 5mg  BID since prior to admission. Patient has completed 7 days of Keflex 500 mg TID with no improvement.  Doxycycline 100 mg BID was ordered but not begun; pt elected to see primary care for evaluation and was sent to [facility] ED. Mr. Polhamus was admitted for cellulitis of left thigh. Per Dr. Konrad Dolores with [facility] this is a related hospital admission. Patient is a DNR.  Visited patient at bedside, who was alert and oriented in NAD and experiencing no current complaints. Patient continues to have swelling and redness/warmth in his left inner thigh area. Patient confirmed that he wishes to pursue long term antibiotics for his myofascitis after discussing treatment options with the Infectious Disease MD this morning. Patient is aware and confirmed wishes to pursue this treatment at this time and stop hospice support until the antibiotics regiment is complete, then he may reach out for hospice services in the future. [facility] SW will contact patient this afternoon to discuss this plan.     V/S:  98.6, 76, 17, 122/68j, 97% on room air I&O:  1080/1300 (-220)  Abnormal lab work: RBC 2.59, Hemo 7.5, HCT 23.4, NA 129, K 3.4, Cal 8.3, Glucose 123 Diagnostics:  No new tests IVs/PRNs: Rocephin 2g in NS 117ml IVPB discontinued today, No prns adm today. Patient started on Keflex 500 mg cap q 8hr on 3/10 at 1257   Problem list:  Left thigh cellulitis without abscess. Patchy mild fasciitis without pyomyositis. Failed outpatient therapy Treated with Keflex 500 mg 3 times daily outpatient/Prescribed doxycycline but never started/Presented due to worsening  pain. Initially on IV Rocephin. Erythema appears to be improving based on initial pictures on admission. Lower extremity Doppler negative for any DVT.Will discuss with ID regarding further recommendation of longer-term antibiotics.  Should the patient require IV antibiotics for longer duration would like to come off of hospice. Hyponatremia Remained stable for last few days. will continue to monitor  DLBCL Metastatic prostate cancer - stable - no active tx Iron deficiency and B12 deficiency anemia with progressive worsening. We will recheck tomorrow. Currently no evidence of active bleeding. Persistent A. fib on Eliquis. HTN. Chronic systolic CHF Blood pressure stable.  Rate controlled.  D/C planning- Patient plans to discharge home Friday on long term antibiotics without support of hospice at this time. [facility] SW to reach out to support patient wishes.  Goals of Care: Clear - Pt is a DNR but is seeking treatment without support of hospice at this time-  to clear infection to improve quality of life at home. May transition to [place] [facility]. Communication with [facility] - Patient able to communicate needs/wishes so did not reach out to spouse who is extremely [facility] and has difficulty communicating via telephone.    Please call with any hospice related questions/concerns,   Gar Ponto, RN [place] (in [place]) [phone]

## 2020-09-02 NOTE — Plan of Care (Signed)

## 2020-09-02 NOTE — Plan of Care (Signed)

## 2020-09-02 NOTE — Progress Notes (Signed)
Physical Therapy Treatment Patient Details Name: Patrick [name] MRN: [id number] DOB: 09/01/1931 Today's Date: 09/02/2020    History of Present Illness Pt is an 87 yo male admitted on 3/4 with L thigh infection/cellulitis/swelling and erythemia. PMH:  nonischemic cardiomyopathy, persistent A. fib on Eliquis, systolic dysfunction of left ventricle, hypertension, LBBB, left scapular fracture, moderate aortic stenosis, multiple left rib fractures, history of closed traumatic pneumothorax, right ulnar neuropathy, sleep apnea not on CPAP, chronic systolic heart failure Pt is under hospice care at the home.    PT Comments    Pt was seen for mobility on RW with min guard support and with resisted ex's done on side of bed.  Pt is having minimal discomfort and is reporting a growing expectation of getting discharged tomorrow.  Follow along with him as needed, working on LLE strength and control of gait with RW and less assist if possible. Pt is still unsafe to walk alone, and RW is needed for gait now vs more independent previous function.  Has only recently been discharged from SNF and will need to continue to work on his maintenance of progress esp with setback of cellulitis.   Follow Up Recommendations  No PT follow up;Supervision/Assistance - 24 hour     Equipment Recommendations  None recommended by PT    Recommendations for Other Services       Precautions / Restrictions Precautions Precautions: Fall Precaution Comments: LLE pain and edema Restrictions Weight Bearing Restrictions: No    Mobility  Bed Mobility Overal bed mobility: Modified Independent Bed Mobility: Supine to Sit     Supine to sit: Modified independent (Device/Increase time)          Transfers Overall transfer level: Needs assistance Equipment used: Rolling walker (2 wheeled) Transfers: Sit to/from Stand Sit to Stand: Min guard            Ambulation/Gait Ambulation/Gait assistance: Catering manager (Feet): 78 Feet Assistive device: Rolling walker (2 wheeled) Gait Pattern/deviations: Step-through pattern;Decreased stride length;Trunk flexed Gait velocity: reduced       Stairs             Wheelchair Mobility    Modified Rankin (Stroke Patients Only)       Balance Overall balance assessment: Mild deficits observed, not formally tested                                          Cognition Arousal/Alertness: Awake/alert Behavior During Therapy: WFL for tasks assessed/performed Overall Cognitive Status: Within Functional Limits for tasks assessed                                        Exercises General Exercises - Lower Extremity Long Arc Quad: Strengthening;10 reps Heel Slides: Strengthening;10 reps Hip ABduction/ADduction: Strengthening;10 reps    General Comments General comments (skin integrity, edema, etc.): pt is up to side of bed and walked with min guard then did resisted ex to BLE's.  He is still weaker on LLE,      Pertinent Vitals/Pain Pain Assessment: Faces Faces Pain Scale: Hurts a little bit Pain Location: L thigh Pain Descriptors / Indicators: [place]  Prior Function            PT Goals (current goals can now be found in the care plan section) Acute Rehab PT Goals Patient Stated Goal: fix my leg and go home Progress towards PT goals: Progressing toward goals    Frequency    Min 3X/week      PT Plan Current plan remains appropriate    Co-evaluation              AM-PAC PT "6 Clicks" Mobility   Outcome Measure  Help needed turning from your back to your side while in a flat bed without using bedrails?: None Help needed moving from lying on your back to sitting on the side of a flat bed without using bedrails?: None Help needed moving to and from a bed to a chair (including a wheelchair)?: None Help needed standing up from a chair using  your arms (e.g., wheelchair or bedside chair)?: A Little Help needed to walk in hospital room?: A Little Help needed climbing 3-5 steps with a railing? : A Little 6 Click Score: 21    End of Session Equipment Utilized During Treatment: Gait belt Activity Tolerance: Patient tolerated treatment well Patient left: in bed;with call bell/phone within reach;with bed alarm set Nurse Communication: Mobility status PT Visit Diagnosis: Unsteadiness on feet (R26.81);Muscle weakness (generalized) (M62.81);Difficulty in walking, not elsewhere classified (R26.2);Other symptoms and signs involving the nervous system (L87.564)     Time: 3329-5188 PT Time Calculation (min) (ACUTE ONLY): 20 min  Charges:  $Gait Training: 8-22 mins                    Ramond Dial 09/02/2020, 8:45 PM Mee Hives, PT [facility] Acute Rehab Dept. Number: [place] and [place]

## 2020-09-03 ENCOUNTER — Inpatient Hospital Stay: Payer: Medicare Other | Admitting: Hematology and Oncology

## 2020-09-03 ENCOUNTER — Inpatient Hospital Stay: Payer: Medicare Other

## 2020-09-03 DIAGNOSIS — Z515 Encounter for palliative care: Secondary | ICD-10-CM

## 2020-09-03 LAB — BASIC METABOLIC PANEL
Anion gap: 8 (ref 5–15)
BUN: 13 mg/dL (ref 8–23)
CO2: 24 mmol/L (ref 22–32)
Calcium: 8.6 mg/dL — ABNORMAL LOW (ref 8.9–10.3)
Chloride: 97 mmol/L — ABNORMAL LOW (ref 98–111)
Creatinine, Ser: 0.62 mg/dL (ref 0.61–1.24)
GFR, Estimated: >60 mL/min (ref >60)
Glucose, Bld: 124 mg/dL — ABNORMAL HIGH (ref 70–99)
Potassium: 3.6 mmol/L (ref 3.5–5.1)
Sodium: 129 mmol/L — ABNORMAL LOW (ref 135–145)

## 2020-09-03 MED ORDER — CEPHALEXIN 500 MG PO CAPS: 500.0000 mg | ORAL_CAPSULE | Freq: Three times a day (TID) | ORAL | 0 refills | Status: AC

## 2020-09-03 MED ORDER — MORPHINE SULFATE (CONCENTRATE) 20 MG/ML PO SOLN: 5.0000 mg | ORAL | 0 refills | Status: AC | PRN

## 2020-09-03 MED ORDER — FUROSEMIDE 10 MG/ML IJ SOLN: 20.0000 mg | Freq: Once | INTRAMUSCULAR | Status: DC

## 2020-09-03 MED ORDER — LORAZEPAM 0.5 MG PO TABS: 0.5000 mg | ORAL_TABLET | Freq: Three times a day (TID) | ORAL | 0 refills | Status: AC | PRN

## 2020-09-03 MED ORDER — POTASSIUM CHLORIDE CRYS ER 20 MEQ PO TBCR: 30.0000 meq | EXTENDED_RELEASE_TABLET | Freq: Once | ORAL | Status: DC

## 2020-09-03 MED ORDER — METHOCARBAMOL 500 MG PO TABS: 500.0000 mg | ORAL_TABLET | Freq: Three times a day (TID) | ORAL | 0 refills | Status: AC | PRN

## 2020-09-03 MED ORDER — FUROSEMIDE 20 MG PO TABS: 20.0000 mg | ORAL_TABLET | Freq: Every day | ORAL | 0 refills | Status: AC | PRN

## 2020-09-03 NOTE — Plan of Care (Signed)
  Problem: Education: Goal: Knowledge of General Education information will improve Description: Including pain rating scale, medication(s)/side effects and non-pharmacologic comfort measures 09/03/2020 1351 by Madaline Brilliant, RN Outcome: Completed/Met 09/03/2020 0734 by Madaline Brilliant, RN Outcome: Progressing   Problem: Health Behavior/Discharge Planning: Goal: Ability to manage health-related needs will improve 09/03/2020 1351 by Madaline Brilliant, RN Outcome: Completed/Met 09/03/2020 0734 by Madaline Brilliant, RN Outcome: Progressing   Problem: Clinical Measurements: Goal: Ability to maintain clinical measurements within normal limits will improve 09/03/2020 1351 by Madaline Brilliant, RN Outcome: Completed/Met 09/03/2020 0734 by Madaline Brilliant, RN Outcome: Progressing Goal: Will remain free from infection 09/03/2020 1351 by Madaline Brilliant, RN Outcome: Completed/Met 09/03/2020 0734 by Madaline Brilliant, RN Outcome: Progressing Goal: Diagnostic test results will improve 09/03/2020 1351 by Madaline Brilliant, RN Outcome: Completed/Met 09/03/2020 0734 by Madaline Brilliant, RN Outcome: Progressing Goal: Respiratory complications will improve 09/03/2020 1351 by Madaline Brilliant, RN Outcome: Completed/Met 09/03/2020 0734 by Madaline Brilliant, RN Outcome: Progressing Goal: Cardiovascular complication will be avoided 09/03/2020 1351 by Madaline Brilliant, RN Outcome: Completed/Met 09/03/2020 0734 by Madaline Brilliant, RN Outcome: Progressing   Problem: Activity: Goal: Risk for activity intolerance will decrease 09/03/2020 1351 by Madaline Brilliant, RN Outcome: Completed/Met 09/03/2020 0734 by Madaline Brilliant, RN Outcome: Progressing   Problem: Nutrition: Goal: Adequate nutrition will be maintained 09/03/2020 1351 by Madaline Brilliant, RN Outcome: Completed/Met 09/03/2020 0734 by Madaline Brilliant, RN Outcome: Progressing   Problem: Coping: Goal: Level of anxiety will decrease 09/03/2020  1351 by Madaline Brilliant, RN Outcome: Completed/Met 09/03/2020 0734 by Madaline Brilliant, RN Outcome: Progressing   Problem: Elimination: Goal: Will not experience complications related to bowel motility 09/03/2020 1351 by Madaline Brilliant, RN Outcome: Completed/Met 09/03/2020 0734 by Madaline Brilliant, RN Outcome: Progressing Goal: Will not experience complications related to urinary retention 09/03/2020 1351 by Madaline Brilliant, RN Outcome: Completed/Met 09/03/2020 0734 by Madaline Brilliant, RN Outcome: Progressing   Problem: Pain Managment: Goal: General experience of comfort will improve 09/03/2020 1351 by Madaline Brilliant, RN Outcome: Completed/Met 09/03/2020 0734 by Madaline Brilliant, RN Outcome: Progressing   Problem: Safety: Goal: Ability to remain free from injury will improve 09/03/2020 1351 by Madaline Brilliant, RN Outcome: Completed/Met 09/03/2020 0734 by Madaline Brilliant, RN Outcome: Progressing   Problem: Skin Integrity: Goal: Risk for impaired skin integrity will decrease 09/03/2020 1351 by Madaline Brilliant, RN Outcome: Completed/Met 09/03/2020 0734 by Madaline Brilliant, RN Outcome: Progressing

## 2020-09-03 NOTE — Plan of Care (Signed)

## 2020-09-03 NOTE — Discharge Instructions (Addendum)

## 2020-09-03 NOTE — Progress Notes (Signed)
Dr Posey Pronto is at the bedside- discussing discharge planning

## 2020-09-03 NOTE — Progress Notes (Signed)
Discharge instruction packet provided to the patient.  The instructions were reviewed and the patient is aware of picking up his prescriptions at [facility].  The patient verbalizes understanding and will be discharged to his home with his wife.

## 2020-09-07 NOTE — Discharge Summary (Signed)
[facility] Discharge Summary   Patient: Patrick [name] IRC:[id number]  PCP: Patient, No Pcp Per  Date of admission: 08/27/2020   Date of discharge: 09/03/2020     Discharge Diagnoses:  Principal Problem:   Cellulitis of left thigh Active Problems:   Prostate cancer metastatic to multiple sites [facility])   Normocytic anemia   Persistent atrial fibrillation (HCC)   Diffuse large B cell lymphoma (HCC)   Severe protein-calorie malnutrition (HCC)   Hyponatremia   Chronic systolic heart failure (last EF 40-45% in Sept/2020)   Hospice care patient   Admitted From: home Disposition:  Home with hospice  Recommendations for Outpatient Follow-up:  1. PCP: Continue hospice care 2. Follow up LABS/TEST: None   Follow-up Information    hospice [facility]. Schedule an appointment as soon as possible for a visit in 1 week(s).              Discharge Instructions    Diet general   Complete by: As directed    Increase activity slowly   Complete by: As directed       Diet recommendation: Regular diet  Activity: The patient is advised to gradually reintroduce usual activities, as tolerated  Discharge Condition: stable  Code Status: DNR   History of present illness: As per the H and P dictated on admission, "Patrick [name] is a 87 y.o. male with medical history significant for nonischemic cardiomyopathy, systolic dysfunction of left ventricle, hypertension, LBBB, left scapular fracture, moderate aortic stenosis, multiple left rib fractures, history of closed traumatic pneumothorax, right ulnar neuropathy, sleep apnea not on CPAP, chronic systolic heart failure who is coming to the emergency department due to progressively worse edema, erythema and tenderness to palpation of the medial left thigh area since last week despite being treated initially with cephalexin.  He was then started on doxycycline without any results.  He went to see his PCP today and was sent to the emergency  department for further evaluation and treatment after his BP was 83/54 mmHg of the office.  He denies fever, chills, malaise, nausea, emesis or decreased appetite.  No rhinorrhea, sore throat, cough, wheezing or hemoptysis.  Denies chest pain, palpitations, dizziness, diaphoresis, PND, orthopnea or pitting edema of the lower extremities.  No abdominal pain, diarrhea, melena or hematochezia.  He occasionally gets constipated.  He denies dysuria, frequency or hematuria.  No polyuria, polydipsia, polyphagia or blurred vision.  ED Course: Initial vital signs were temperature 98.3 F, pulse 67, respirations 14, BP 109/71 mmHg and O2 sat 100% on room air.  The patient received a 1000 mL LR bolus and 1500 mg of vancomycin."  Hospital Course:  Summary of his active problems in the hospital is as following. 1.  Left thigh cellulitis without abscess. Patchy mild fasciitis without pyomyositis. Failed outpatient therapy Treated with Keflex 500 mg 3 times daily outpatient.  Prescribed doxycycline but never started.  Presented due to worsening pain. Initially on IV Rocephin. MRSA PCR negative. Pain well controlled.  Induration not improved. Erythema appears to be improving based on initial pictures on admission. Lower extremity Doppler negative for any DVT. We will transition to oral Keflex 500 3 times daily likely will continue for 4 weeks.  2.  Hyponatremia hypervolemic/hypotonic Acute in nature.  Likely with volume overload.  Mild component of SIADH.  Currently asymptomatic. Baseline sodium 139.  At the lowest sodium 127.  Remained stable for last few days. Osmolarity also low. Treated with sodium tablets as well as Urea tablets.  1 dose of IV Lasix on 3/10.  Continue as needed Lasix on discharge.  3.  DLBCL Metastatic prostate cancer. Iron deficiency and B12 deficiency anemia with progressive worsening. Hemoglobin stable around 7. Currently no evidence of active bleeding. Baseline hemoglobin  around 11. Reticulocyte count normal.  Iron level 18.  Folate level 10.  B12 199. We will initiate replacement.  No IV therapy given his acute infection. Patient currently does not want any treatment for his malignancies and remains hospice appropriate.  4.  Persistent A. fib on Eliquis. HTN. Chronic systolic CHF Blood pressure stable.  Rate controlled. On Coreg 6.25 mg twice daily. On Eliquis given the anemia we will continue to closely monitor. Resume Entresto Left leg has swelling and edema which is likely associated with his infection rather than volume overload. As needed Lasix on discharge.  5.  Goals of care Patient remains DNR/DNI. Currently active with hospice. We will not be needing IV antibiotics for now therefore patient can continue his on hospice therapy. Does not want any therapy for his malignancies.  Pain control  - [employer] Controlled Substance Reporting System database was reviewed. -Patient is a hospice patient.  On the day of the discharge the patient's vitals were stable, and no other acute medical condition were reported by patient. The patient was felt safe to be discharge at Home with with hospice.  Consultants: None Procedures: None  DISCHARGE MEDICATION: Allergies as of 09/03/2020      Reactions   Macrobid [nitrofurantoin Monohyd Macro] Rash   fever      Medication List    STOP taking these medications   doxycycline 100 MG capsule Commonly known as: VIBRAMYCIN     TAKE these medications   Calcium 280 MG Tabs Take 4 tablets by mouth daily.   carvedilol 6.25 MG tablet Commonly known as: COREG Take 6.25 mg by mouth 2 (two) times daily.   cephALEXin 500 MG capsule Commonly known as: KEFLEX Take 1 capsule (500 mg total) by mouth 3 (three) times daily for 28 days.   Eliquis 5 MG Tabs tablet Generic drug: apixaban Take 5 mg by mouth 2 (two) times daily.   Entresto 49-51 MG Generic drug: sacubitril-valsartan Take 1 tablet by  mouth at bedtime.   ferrous sulfate 325 (65 FE) MG tablet Take 325 mg by mouth daily.   furosemide 20 MG tablet Commonly known as: Lasix Take 1 tablet (20 mg total) by mouth daily as needed for fluid or edema.   LORazepam 0.5 MG tablet Commonly known as: Ativan Take 1 tablet (0.5 mg total) by mouth every 8 (eight) hours as needed for anxiety.   methocarbamol 500 MG tablet Commonly known as: ROBAXIN Take 1 tablet (500 mg total) by mouth every 8 (eight) hours as needed for muscle spasms.   morphine 20 MG/ML concentrated solution Commonly known as: ROXANOL Take 0.25 mLs (5 mg total) by mouth every 2 (two) hours as needed for moderate pain, severe pain or shortness of breath.   multivitamin with minerals Tabs tablet Take 1 tablet by mouth daily.   Nutritional Supplement Liqd Take 120 mLs by mouth 2 (two) times daily. Med Pass   pantoprazole 20 MG tablet Commonly known as: PROTONIX Take 20 mg by mouth daily.       Discharge Exam: Filed Weights   08/28/20 0000 09/02/20 1214  Weight: 79 kg 79 kg   Vitals:   09/03/20 0605 09/03/20 0736  BP: 125/72 (!) 147/85  Pulse: 89 (!) 103  Resp: 18 17  Temp: 99.1 F (37.3 C) (!) 97.5 F (36.4 C)  SpO2: 95% 97%   General: Appear in no distress, left thigh redness with induration, no other rash; Oral Mucosa Clear, moist. no Abnormal Neck Mass Or lumps, Conjunctiva normal  Cardiovascular: S1 and S2 Present, no Murmur Respiratory: good respiratory effort, Bilateral Air entry present and CTA, no Crackles, no wheezes Abdomen: Bowel Sound present, Soft and no tenderness Extremities: bilateral  Pedal edema Neurology: alert and oriented to time, place, and person affect appropriate. no new focal deficit  The results of significant diagnostics from this hospitalization (including imaging, microbiology, ancillary and laboratory) are listed below for reference.    Significant Diagnostic Studies: MR FEMUR LEFT W WO CONTRAST  Result Date:  08/30/2020 CLINICAL DATA:  Left thigh swelling for 2 weeks. History of prostate cancer and lymphoma. EXAM: MR OF THE LEFT LOWER EXTREMITY WITHOUT AND WITH CONTRAST TECHNIQUE: Multiplanar, multisequence MR imaging of the left thigh was performed both before and after administration of intravenous contrast. CONTRAST:  64mL GADAVIST GADOBUTROL 1 MMOL/ML IV SOLN COMPARISON:  None. FINDINGS: Examination limited by patient motion, in particular the postcontrast images are quite limited. Diffuse subcutaneous soft tissue swelling/edema/fluid possibly reflecting cellulitis. No discrete rim enhancing fluid collection to suggest an abscess. There is also diffuse patchy myofasciitis without definite findings for pyomyositis. Patchy abnormal marrow signal in both femurs with subsequent areas of enhancement could be related to lymphoma or prostate cancer. IMPRESSION: 1. Diffuse subcutaneous soft tissue swelling/edema/fluid possibly reflecting cellulitis. 2. No discrete rim enhancing fluid collection to suggest an abscess. 3. Diffuse patchy Myofasciitis without definite findings for pyomyositis. 4. Patchy abnormal marrow signal in both femurs with subsequent areas of enhancement could be related to lymphoma or prostate cancer. Electronically Signed   By: Marijo Sanes M.D.   On: 08/30/2020 16:02   VAS [place] LOWER EXTREMITY VENOUS (DVT)  Result Date: 08/30/2020  Lower Venous DVT Study Indications: Erythema, Edema, and cellulitis of thigh.  Limitations: Edema. Comparison Study: No prior study on file Performing Technologist: Sharion Dove RVS  Examination Guidelines: A complete evaluation includes B-mode imaging, spectral Doppler, color Doppler, and power Doppler as needed of all accessible portions of each vessel. Bilateral testing is considered an integral part of a complete examination. Limited examinations for reoccurring indications may be performed as noted. The reflux portion of the exam is performed with the patient in  reverse Trendelenburg.  +-----+---------------+---------+-----------+----------+--------------+ RIGHTCompressibilityPhasicitySpontaneityPropertiesThrombus Aging +-----+---------------+---------+-----------+----------+--------------+ CFV  Full           Yes      Yes                                 +-----+---------------+---------+-----------+----------+--------------+   +---------+---------------+---------+-----------+----------+--------------+ LEFT     CompressibilityPhasicitySpontaneityPropertiesThrombus Aging +---------+---------------+---------+-----------+----------+--------------+ CFV      Full           Yes      Yes                                 +---------+---------------+---------+-----------+----------+--------------+ SFJ      Full                                                        +---------+---------------+---------+-----------+----------+--------------+  FV Prox  Full                                                        +---------+---------------+---------+-----------+----------+--------------+ FV Mid   Full                                                        +---------+---------------+---------+-----------+----------+--------------+ FV DistalFull                                                        +---------+---------------+---------+-----------+----------+--------------+ PFV      Full                                                        +---------+---------------+---------+-----------+----------+--------------+ POP      Full           Yes      Yes                                 +---------+---------------+---------+-----------+----------+--------------+ PTV      Full                                                        +---------+---------------+---------+-----------+----------+--------------+ PERO     Full                                                         +---------+---------------+---------+-----------+----------+--------------+ GSV      Partial        Yes      Yes                                 +---------+---------------+---------+-----------+----------+--------------+     Summary: RIGHT: - No evidence of common femoral vein obstruction.  LEFT: - There is no evidence of deep vein thrombosis in the lower extremity. - There is no evidence of superficial venous thrombosis.  - There appears to be phlebitis of the great saphenous vein. The vein does not fully compress in some areas and appears mildly thickened, however, Doppler flow is normal  *See table(s) above for measurements and observations. Electronically signed by Ruta Hinds MD on 08/30/2020 at 9:16:54 PM.    Final     Microbiology: Recent Results (from the past 240 hour(s))  SARS CORONAVIRUS 2 (TAT 6-24 HRS) Nasopharyngeal Nasopharyngeal Swab     Status: None  Collection Time: 08/28/20  1:35 PM   Specimen: Nasopharyngeal Swab  Result Value Ref Range Status   SARS Coronavirus 2 NEGATIVE NEGATIVE Final    Comment: (NOTE) SARS-CoV-2 target nucleic acids are NOT DETECTED.  The SARS-CoV-2 RNA is generally detectable in upper and lower respiratory specimens during the acute phase of infection. Negative results do not preclude SARS-CoV-2 infection, do not rule out co-infections with other pathogens, and should not be used as the sole basis for treatment or other patient management decisions. Negative results must be combined with clinical observations, patient history, and epidemiological information. The expected result is Negative.  Fact Sheet for Patients: SugarRoll.be  Fact Sheet for Healthcare Providers: https://www.woods-mathews.com/  This test is not yet approved or cleared by the [place] FDA and  has been authorized for detection and/or diagnosis of SARS-CoV-2 by FDA under an Emergency Use Authorization (EUA). This EUA will  remain  in effect (meaning this test can be used) for the duration of the COVID-19 declaration under Se ction 564(b)(1) of the Act, 21 U.S.C. section 360bbb-3(b)(1), unless the authorization is terminated or revoked sooner.  Performed at [facility], [place] [address], [place], [place] [address]   MRSA PCR Screening     Status: None   Collection Time: 08/29/20  7:52 PM   Specimen: Nasal Mucosa; Nasopharyngeal  Result Value Ref Range Status   MRSA by PCR NEGATIVE NEGATIVE Final    Comment:        The GeneXpert MRSA Assay (FDA approved for NASAL specimens only), is one component of a comprehensive MRSA colonization surveillance program. It is not intended to diagnose MRSA infection nor to guide or monitor treatment for MRSA infections. Performed at [facility], [place] [address] [place], [place] [address]      Labs: CBC: Recent Labs  Lab 09/01/20 0228 09/01/20 1834 09/02/20 0859  WBC 7.3 5.0 7.5  NEUTROABS  --   --  6.4  HGB 7.1* 7.8* 7.5*  HCT 21.2* 24.3* 23.4*  MCV 89.8 90.7 90.3  PLT 271 302 093   Basic Metabolic Panel: Recent Labs  Lab 09/01/20 0228 09/01/20 1834 09/02/20 0859 09/02/20 1613 09/03/20 0817  NA 127* 127* 129* 129* 129*  K 3.8 3.8 3.4* 3.0* 3.6  CL 99 99 99 98 97*  CO2 20* 22 24 25 24   GLUCOSE 104* 131* 123* 100* 124*  BUN 11 13 11 13 13   CREATININE 0.65 0.64 0.62 0.61 0.62  CALCIUM 8.2* 8.3* 8.3* 8.2* 8.6*   Liver Function Tests: Recent Labs  Lab 09/02/20 0859  AST 55*  ALT 36  ALKPHOS 63  BILITOT 0.5  PROT 4.9*  ALBUMIN 2.0*   CBG: No results for input(s): GLUCAP in the last 168 hours.  Time spent: 35 minutes  Signed:  Berle Mull  [facility] 09/03/2020 8:06 AM

## 2021-01-24 DEATH — deceased
